# Patient Record
Sex: Female | Born: 1985 | Race: White | Hispanic: No | Marital: Single | Smoking: Current every day smoker
Health system: Southern US, Academic
[De-identification: ages and names within clinical notes are randomized; demographics above are authoritative.]

## PROBLEM LIST (undated history)

## (undated) DIAGNOSIS — M797 Fibromyalgia: Secondary | ICD-10-CM

## (undated) DIAGNOSIS — G35 Multiple sclerosis: Secondary | ICD-10-CM

---
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
## 2004-07-01 ENCOUNTER — Emergency Department (HOSPITAL_COMMUNITY): Admission: EM | Admit: 2004-07-01 | Discharge: 2004-07-01 | Payer: Self-pay | Admitting: Emergency Medicine

## 2005-01-18 ENCOUNTER — Inpatient Hospital Stay (HOSPITAL_COMMUNITY): Admission: AD | Admit: 2005-01-18 | Discharge: 2005-01-18 | Payer: Self-pay | Admitting: *Deleted

## 2005-01-20 ENCOUNTER — Inpatient Hospital Stay (HOSPITAL_COMMUNITY): Admission: AD | Admit: 2005-01-20 | Discharge: 2005-01-21 | Payer: Self-pay

## 2005-01-24 ENCOUNTER — Ambulatory Visit: Payer: Self-pay | Admitting: *Deleted

## 2005-02-07 ENCOUNTER — Ambulatory Visit: Payer: Self-pay | Admitting: *Deleted

## 2005-02-07 ENCOUNTER — Ambulatory Visit (HOSPITAL_COMMUNITY): Admission: RE | Admit: 2005-02-07 | Discharge: 2005-02-07 | Payer: Self-pay | Admitting: *Deleted

## 2005-02-09 ENCOUNTER — Ambulatory Visit: Payer: Self-pay | Admitting: *Deleted

## 2005-02-11 ENCOUNTER — Inpatient Hospital Stay (HOSPITAL_COMMUNITY): Admission: RE | Admit: 2005-02-11 | Discharge: 2005-02-14 | Payer: Self-pay | Admitting: Obstetrics & Gynecology

## 2005-02-11 ENCOUNTER — Ambulatory Visit: Payer: Self-pay | Admitting: Family Medicine

## 2005-02-11 ENCOUNTER — Encounter (INDEPENDENT_AMBULATORY_CARE_PROVIDER_SITE_OTHER): Payer: Self-pay | Admitting: Specialist

## 2005-08-05 ENCOUNTER — Emergency Department (HOSPITAL_COMMUNITY): Admission: EM | Admit: 2005-08-05 | Discharge: 2005-08-05 | Payer: Self-pay | Admitting: Emergency Medicine

## 2005-09-10 ENCOUNTER — Emergency Department (HOSPITAL_COMMUNITY): Admission: EM | Admit: 2005-09-10 | Discharge: 2005-09-10 | Payer: Self-pay | Admitting: Emergency Medicine

## 2005-11-25 ENCOUNTER — Emergency Department (HOSPITAL_COMMUNITY): Admission: EM | Admit: 2005-11-25 | Discharge: 2005-11-25 | Payer: Self-pay | Admitting: Emergency Medicine

## 2005-12-26 ENCOUNTER — Emergency Department (HOSPITAL_COMMUNITY): Admission: EM | Admit: 2005-12-26 | Discharge: 2005-12-26 | Payer: Self-pay | Admitting: Emergency Medicine

## 2006-12-02 ENCOUNTER — Emergency Department (HOSPITAL_COMMUNITY): Admission: EM | Admit: 2006-12-02 | Discharge: 2006-12-03 | Payer: Self-pay | Admitting: Emergency Medicine

## 2007-03-08 ENCOUNTER — Emergency Department (HOSPITAL_COMMUNITY): Admission: EM | Admit: 2007-03-08 | Discharge: 2007-03-08 | Payer: Self-pay | Admitting: Emergency Medicine

## 2007-03-11 ENCOUNTER — Emergency Department (HOSPITAL_COMMUNITY): Admission: EM | Admit: 2007-03-11 | Discharge: 2007-03-12 | Payer: Self-pay | Admitting: *Deleted

## 2007-07-14 ENCOUNTER — Ambulatory Visit: Payer: Self-pay | Admitting: Family Medicine

## 2007-07-14 ENCOUNTER — Inpatient Hospital Stay (HOSPITAL_COMMUNITY): Admission: AD | Admit: 2007-07-14 | Discharge: 2007-07-16 | Payer: Self-pay | Admitting: Family Medicine

## 2007-07-22 ENCOUNTER — Emergency Department (HOSPITAL_COMMUNITY): Admission: EM | Admit: 2007-07-22 | Discharge: 2007-07-22 | Payer: Self-pay | Admitting: Emergency Medicine

## 2007-07-24 ENCOUNTER — Emergency Department (HOSPITAL_COMMUNITY): Admission: EM | Admit: 2007-07-24 | Discharge: 2007-07-24 | Payer: Self-pay | Admitting: Emergency Medicine

## 2007-08-22 ENCOUNTER — Encounter: Admission: RE | Admit: 2007-08-22 | Discharge: 2007-11-20 | Payer: Self-pay | Admitting: Orthopedic Surgery

## 2008-04-14 ENCOUNTER — Emergency Department (HOSPITAL_COMMUNITY): Admission: EM | Admit: 2008-04-14 | Discharge: 2008-04-14 | Payer: Self-pay | Admitting: Emergency Medicine

## 2008-04-16 ENCOUNTER — Emergency Department (HOSPITAL_COMMUNITY): Admission: EM | Admit: 2008-04-16 | Discharge: 2008-04-16 | Payer: Self-pay | Admitting: Emergency Medicine

## 2008-09-24 ENCOUNTER — Emergency Department (HOSPITAL_COMMUNITY): Admission: EM | Admit: 2008-09-24 | Discharge: 2008-09-24 | Payer: Self-pay | Admitting: Emergency Medicine

## 2009-08-24 ENCOUNTER — Ambulatory Visit (INDEPENDENT_AMBULATORY_CARE_PROVIDER_SITE_OTHER): Payer: Self-pay | Admitting: Rheumatology

## 2009-08-24 NOTE — Telephone Encounter (Signed)
Triage Queue message copied by Clois Comber CHABLIS A on Wed Aug 24, 2009 11:18 AM  ------   Message from: Konrad Felix   Created: Wed Aug 24, 2009 10:47 AM    >> Frances Maywood Aug 24, 2009 10:47 am  Patient is scheduled to see Dr. Tenna Child 02/07/10 and her referring doctor, Dr. 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 They are faxing records.

## 2009-09-14 ENCOUNTER — Ambulatory Visit (INDEPENDENT_AMBULATORY_CARE_PROVIDER_SITE_OTHER): Payer: MEDICAID | Admitting: Rheumatology

## 2009-09-14 ENCOUNTER — Encounter (INDEPENDENT_AMBULATORY_CARE_PROVIDER_SITE_OTHER): Payer: Self-pay | Admitting: Rheumatology

## 2009-09-14 ENCOUNTER — Ambulatory Visit
Admission: RE | Admit: 2009-09-14 | Discharge: 2009-09-14 | Disposition: A | Discharge status: Home / Self Care | Payer: MEDICAID | Attending: Rheumatology | Admitting: Rheumatology

## 2009-09-14 VITALS — BP 112/72 | HR 83 | Temp 97.3°F | Ht 62.0 in | Wt 110.6 lb

## 2009-09-14 DIAGNOSIS — R21 Rash and other nonspecific skin eruption: Secondary | ICD-10-CM | POA: Insufficient documentation

## 2009-09-14 LAB — CREATININE
CREATININE: 0.75 mg/dL (ref 0.49–1.10)
ESTIMATED GLOMERULAR FILTRATION RATE: >59 mL/min/{1.73_m2} (ref >59)

## 2009-09-14 LAB — ELECTROLYTES
ANION GAP: 9 mmol/L (ref 5–16)
CARBON DIOXIDE: 29 mmol/L (ref 22–32)
CHLORIDE: 101 mmol/L (ref 96–111)
POTASSIUM: 4.3 mmol/L (ref 3.5–5.1)
SODIUM: 139 mmol/L (ref 136–145)

## 2009-09-14 LAB — CBC/DIFF
BASOPHILS: 1 % (ref 0–1)
BASOS ABS: 0.039 10*3/uL (ref 0.0–0.2)
EOS ABS: 0.078 10*3/uL — ABNORMAL LOW (ref 0.1–0.3)
EOSINOPHIL: 2 % (ref 1–6)
HCT: 40 % (ref 33.5–45.2)
HGB: 13.9 g/dL (ref 11.5–15.2)
LYMPHOCYTES: 53 % — ABNORMAL HIGH (ref 20–45)
LYMPHS ABS: 2.067 10*3/uL (ref 1.0–4.8)
MCH: 30.9 pg (ref 27.4–33.0)
MCHC: 34.6 g/dL (ref 31.6–35.5)
MONOCYTES: 8 % (ref 4–13)
MONOS ABS: 0.312 10*3/uL (ref 0.1–0.9)
MPV: 7.4 FL (ref 7.4–10.4)
PLATELET COUNT: 192 10*3/uL (ref 140–450)
PMN ABS: 1.404 10*3/uL — ABNORMAL LOW (ref 1.5–7.7)
PMN'S: 36 % — ABNORMAL LOW (ref 40–75)
RBC: 4.49 MIL/uL (ref 3.84–5.04)
RDW: 10.7 % (ref 10.2–14.0)
WBC: 3.9 10*3/uL (ref 3.5–11.0)

## 2009-09-14 LAB — URINALYSIS, MACROSCOPIC AND MICROSCOPIC
BILIRUBIN: NEGATIVE
BLOOD: NEGATIVE
GLUCOSE: NEGATIVE mg/dL
KETONES: NEGATIVE mg/dL
LEUKOCYTES: NEGATIVE
NITRITE: NEGATIVE
PH URINE: 5.5 (ref 5.0–8.0)
PROTEIN: NEGATIVE mg/dL
RBC'S: <1 /HPF (ref 0–4)
SPECIFIC GRAVITY, URINE: 1.009 (ref 1.005–1.030)
UROBILINOGEN: NORMAL mg/dL
WBC'S: <1 /HPF (ref 0–6)

## 2009-09-14 LAB — SEDIMENTATION RATE: SEDIMENTATION RATE: 3 mm/h (ref 0–20)

## 2009-09-14 LAB — C3 COMPLEMENT, SERUM: C3: 93 mg/dL (ref 86–184)

## 2009-09-14 LAB — BUN
BUN/CREAT RATIO: 11 (ref 6–22)
BUN: 8 mg/dL (ref 6–20)

## 2009-09-14 LAB — ALT (SGPT): ALT (SGPT): 18 U/L (ref 7–45)

## 2009-09-14 LAB — CALCIUM: CALCIUM: 9.3 mg/dL (ref 8.5–10.4)

## 2009-09-14 LAB — AST (SGOT): AST (SGOT): 25 U/L (ref 8–41)

## 2009-09-14 LAB — ALBUMIN: ALBUMIN: 3.9 g/dL (ref 3.5–4.8)

## 2009-09-14 LAB — C4 COMPLEMENT, SERUM: C4: 14 mg/dL — ABNORMAL LOW (ref 20–59)

## 2009-09-14 NOTE — H&P (Signed)
PCP: Dene Gentry from [place], [place]           Shawn Route , [facility] [place], [place]  Dermatologist: Memory Dance from [place], [place]      Chief Complaint  Rash- referred by her dermatologist (Dr. Johna Roles from [place]) for concern of lupus    HPI  23year old white female referred to [place] for concerns of lupus. Patient tells [place] that she developed a rash on her chest about 2-59months ago that progressed to involve her arms and then her back and face(malar). Initially she was treated with local application creams but it didn't subside. She also noticed numbness and tingling and swelling in her feet in the mornings, generalised fatigue and stomach cramps. She also gives a history of right wrist pain for 1year and low back pain for 1-36months. She doesn't recall any trauma. No fevers, no chills, no nausea, no vomiting, no diarrhea, no urinary complains, no flank pain, no other joint pains, no hematuria, no oral sores. She complains of frontal headache for a week, peripheral black falshes in right eye for a month intermittently, coughing and dry mouth in the morning but no apparent swallowing difficulty, dry eyes occasionally, c/o excessive hair fall. She was referred to a dermatologist by her PCP, when she didn't respond to local treatments he did blood work that showed elevated ANA 534, SSA 534, WBC slightly low 3.8, low plt 139. He prescribed her prednisone 2weeks course. She told us that she responded to the treatment and the rash on the arms cleared up and the chest significantly improved.   Currently she has erythematous blanchanble patches on her cheek bilaterally, 2 lesions each on heer breasts and 1-2 lesions on her upper back, wrist pain and morning foot discomfot and swelling.    No history of sick contacts, no exposure to woods, no animal bites, no insect bites, no recent travel.    ROS  Negative apart from whats been mentioned in HPI    PMH  Anxiety    PSH  Appendectomy around 1990     Allergy  NKDA    Medications  Hydrocortisone on face  Clonidine 0.1QHS    Social history  Smokes 3cig/day for 1-2 year  No alcohol  No illicit drug, no IV drug  Works in [employer], political [employer]  Has a cat doing fine    Menstrual history  Has a daughter  Regular 28 day cycles  Menses last fo 4 days  Just started her period yesterday  No mennorrhagia  No dysmennorrhea  Sexually active  Safe sexual practices    Family history  Mother had breast cancer at age of 53  Maternal grandfather had DM      Physical Examination  BP 112/72   Pulse 83   Temp 36.3 C (97.3 F)   Ht 1.575 m (5\' 2" )   Wt 50.168 kg (110 lb 9.6 oz)   SpO2 98%    Gen: healthy looking young female, no apparnt abnormality  HEENT: blanchable erythematous malar patches, no oral lesions  Chest: clear  CV: S1+S2  Skin: blanchable malar rash on face, 1-2 erythematous crusted lesions on both breasts, 1-2 lesion on left upper back  Abd: soft, non tender  MS: Right 4th MCP mild tenderness, tinel' test positive, phalen's negative  Neuro: AAOX3, CN II-XII intact  Psyche: coherent    Assessment/ Plan  Orders Placed This Encounter   . Cbc/diff   . Sedimentation rate   . Urinalysis (routine)   . Creatinine   .  C4 complement   . C3 complement   . Anti-double stranded dna ab   . Anti-ssb ab   . Anti-rnp antibody   . Ana   . Anti-sm antibody   . Ast (sgot)   . Alt (sgpt)   . Albumin   . Anti-ssa ab   . Electrolytes   . Bun   . Vitamin d 25 hydroxy   . Calcium   . Lupus anticoagulant   . Cardiolipin ab, igg and igm         1. Rash: 23year old female with 2-3 month history of rash, fatigue and feet dicomfort with elevated ANA, SSA, low plt from outside labs concerning for both rheumatologic disease and/or non-rheumatologic disease. Will order abovesaid blood test and UA. Will follow up with the results.  2. Followup: Will see her back in 1-2weeks time.    Rosiland Oz  PGY-3

## 2009-09-15 LAB — ANTI-DOUBLE STRANDED DNA AB: ANTI-DNA AB: 1:10 {titer}

## 2009-09-15 LAB — HEP-2 SUBSTRATE ANTINUCLEAR ANTIBODIES (ANA), SERUM: ANTI-NUCLEAR AB: NEGATIVE

## 2009-09-15 NOTE — H&P (Signed)
I saw and examined the patient.  I reviewed the resident's note.  I agree with the findings and plan of care as documented in the resident's note.  Any exceptions/additions are edited/noted.  She denies Raynaud's, oral ulcers, schest pain, SOB. She does note dry eyes, mouth and alopecia  annular raised, red areas noted on her cheeks and upper back.  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

## 2009-09-16 LAB — SS-B/LA ANTIBODIES, IGG, SERUM: ANTI-SSB AB, QUANTITATIVE: 1 U

## 2009-09-16 LAB — LUPUS ANTICOAGULANT: LUPUS ANTICOAGULANT: NEGATIVE

## 2009-09-16 LAB — VITAMIN D 25 HYDROXY: 25-HYDROXY VITAMIN D: 48

## 2009-09-16 LAB — SS-A/RO ANTIBODIES, IGG, SERUM: ANTI-SSA AB, QUANTITATIVE: 112 U — ABNORMAL HIGH

## 2009-09-16 LAB — PHOSPHOLIPID (CARDIOLIPIN) IGG & IGM
CARDIOLIPIN AB IGG: 3.8
CARDIOLIPIN AB IgM: 5.6

## 2009-09-16 LAB — RNP ANTIBODIES, IGG, SERUM: RIBOSOMAL PROTEIN AB, IGG (U1) QUANTITATIVE: 1 U

## 2009-09-16 LAB — SM (SMITH) ANTIBODIES, IGG, SERUM: ANTI-SM AB, QUANTITATIVE: 2 U

## 2009-10-13 ENCOUNTER — Ambulatory Visit (INDEPENDENT_AMBULATORY_CARE_PROVIDER_SITE_OTHER): Payer: MEDICAID | Admitting: Rheumatology

## 2009-10-13 VITALS — BP 104/68 | HR 80 | Temp 96.6°F | Ht 62.0 in | Wt 111.9 lb

## 2009-10-13 NOTE — Progress Notes (Signed)
SUBJECTIVE:  Jacqueline Lam is a 23 y.o. female  Who presents for a follow up visit for: RASH.  The patient was referred to rheumatololgy clinic last month by dermatology after she developed an erythematous rash on her chest about for about 2-31months then it progressed to involve her arms and then her back and face(malar) the patient also has a positive ANA at that time.  Today she came for a follow up and to discuss the result of her labs (autoimmune panel), her symptoms basically unchanged since we saw her last month, the rash in her face upper chest unchanged, she has 2 new spots of an erythematous rash in both of her shoulders, She is still complaining of   numbness and tingling and swelling in her feet in the mornings, generalized fatigue, right wrist pain, no fevers, no chills, no nausea, no vomiting, no diarrhea, no urinary complains, no flank pain, no other joint pains, no hematuria, no oral sores.  She does not complain of any dysphagia or dry mouth or mouth ulcer or problem with her teeth but she does complain of mild dryness in her eyes. She does also complain of SOB on exertion which a relatively a new complaints for her.         OBJECTIVE:   BP 104/68   Pulse 80   Temp 35.9 C (96.6 F)   Ht 1.575 m (5\' 2" )   Wt 50.758 kg (111 lb 14.4 oz)   SpO2 99%   Appears well, in no apparent distress. Alert and oriented.   No jvd, no lymphadenopathy, no lower extremities edema.  Eyes: normal conjunctivae and lids. Pupils equally reactive to light and accommodation. Extraocular muscles intact.   Head: normocephalic and atraumatic.   Mouth/Throat: no oropharyngeal exudate.   Lungs: clear to auscultation bilaterally, no crackles no wheezing.  Cardiovascular: regular rate and rhythm, normal first and second heart sounds, no murmurs, rubs, or gallops.   Abdomen: soft, non-tender, non-distended, normoactive bowel sounds, no hepatosplenomegaly or masses.   Musculoskeletal: no clubbing, cyanosis, or edema, no joint deformities, no joint tenderness except for Right 4th MCP mild tenderness and swelling ( No synovitis by my exam- JAH)  Neurological: awake, alert, and oriented x3, DTR's 2+ equal, strength 5/5, sensation grossly intact, CN II-XII intact  Skin: blanchable malar rash on face, 1-2 erythematous erythematous lesions on both breasts, 1-2 erhythmatous lesion on left upper back, 3 erhythmatous lesions in both shoulders.    ASSESSMENT AND PLAN:  23 y.o. female  with 3 month history of rash came for a follow up of her labs result.  The autoimmune work up was only positive for anti-ssa which is non-specific and it does not correlate with patient presentation. The differential diagnosis includes Sjogren's syndrome,  subacute coetaneous lupus, primary dermatology process.  -we recommends a skin biopsy.   -will refer to ophthalmology for eye exam and possible schirmer test and biopsy.  -patient will follow up with her PCP for lymphocytosis and SOB  -follow up in approximately 1 month

## 2009-10-16 ENCOUNTER — Emergency Department (HOSPITAL_COMMUNITY): Admission: EM | Admit: 2009-10-16 | Discharge: 2009-10-16 | Payer: Self-pay | Admitting: Emergency Medicine

## 2009-10-17 NOTE — Progress Notes (Signed)
See resident's note for details. I saw and evaluated the patient and agree with the resident's findings and plans as written except as noted:  She had no synovitis by my examination.  Mild lymphocytosis and atypical lymphs on CBC  Have recommended she see her pcp for evaluation of this. Actually, it is more common to see lymphopenia in SLE.  Will refer her to Ophthalmology for schirmer's test and if positive, will request a lip biopsy lo to confirm Sjogren's  Recommend Derm f/u for skin biopsy and she will call  Her dermatologist to be seen for that  Alwyn Pea, MD 10/17/2009, 12:55 PM

## 2009-11-30 ENCOUNTER — Ambulatory Visit
Admission: RE | Admit: 2009-11-30 | Discharge: 2009-11-30 | Disposition: A | Discharge status: Home / Self Care | Payer: MEDICAID | Attending: Rheumatology | Admitting: Rheumatology

## 2009-11-30 ENCOUNTER — Ambulatory Visit (INDEPENDENT_AMBULATORY_CARE_PROVIDER_SITE_OTHER): Payer: MEDICAID | Admitting: Rheumatology

## 2009-11-30 VITALS — BP 114/72 | HR 104 | Temp 98.8°F | Ht 62.0 in | Wt 108.6 lb

## 2009-11-30 DIAGNOSIS — R21 Rash and other nonspecific skin eruption: Secondary | ICD-10-CM | POA: Insufficient documentation

## 2009-11-30 DIAGNOSIS — H11149 Conjunctival xerosis, unspecified, unspecified eye: Secondary | ICD-10-CM | POA: Insufficient documentation

## 2009-11-30 NOTE — Progress Notes (Signed)
Subjective  Jacqueline Lam is a 24 y.o. year old female who presents for Results, Has Questions and Follow-up  to clinic.    Patient reports that she had not gotten a skin biopsy yet and will not see her until the 12th for skin biopsy.    She reports that her Ophthalmologic exam was abnormal with very severely dry eyes.  Records from Ophthalmologist revealed significantly dry eyes.    She reports that since her last visit.  She has not had any more episodes of shortness of breath.  She reports that prior to the last visit she was having a large amount of fatigue and joint ache, but had improved since last visit.  She reports that the rash is unchanged.  She reports that she has not had any oral lesions.    Objective    Vitals: BP 114/72   Pulse 104   Temp 37.1 C (98.8 F)   Ht 1.575 m (5\' 2" )   Wt 49.261 kg (108 lb 9.6 oz)   SpO2 98%  General:  no acute distress  HENT:Mouth without ulceration.  Cardiovascular: RRR without murmur.  Lungs: Clear to auscultation bilaterally.   Joints:  Full ROM and no synovitis of the upper or lower extremities  Skin: malar rash  Neurologic: Up to exam table without assistance, Grossly intact.  Lymphatics: No lymphadenopathy    Assessment/Plan  Encounter Diagnoses   Code Name Primary? Qualifier   . 782.1R Rash Yes    . 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  Patient to get skin biopsy in the future.  If biopsy reveals Lupus than will begin treatment.  If biopsy is nondiagnostic than will likely refer for salivary gland biopsy.    Will get G6PD testing at this time to see if patient can take Plaquenil in the future.  Information about Plaquenil was given to the patient for her to read.    Orders Placed This Encounter   . Gluc-6-phosphate dehydrogenase       Mellody Drown, MD

## 2009-11-30 NOTE — Progress Notes (Signed)
I saw and evaluated the patient. I reviewed the resident's note. I agree with the findings and plan of care as documented in the resident's note. Any exceptions/additions are noted.      Alyssha Housh Ann Allen Marilou Barnfield, MD 11/30/2009, 3:43 PM

## 2009-12-02 LAB — GLUCOSE-6-PHOSPHATE DEHYDROGENASE (G-6-PD), QUANTITATIVE, ERYTHROCYTES: GLUC-6-PHOS. DEHYDROG.: 11.5

## 2010-02-07 ENCOUNTER — Ambulatory Visit (INDEPENDENT_AMBULATORY_CARE_PROVIDER_SITE_OTHER): Payer: Self-pay | Admitting: Rheumatology

## 2010-02-28 ENCOUNTER — Encounter (INDEPENDENT_AMBULATORY_CARE_PROVIDER_SITE_OTHER): Payer: MEDICAID | Admitting: Rheumatology

## 2010-04-27 ENCOUNTER — Ambulatory Visit
Admission: RE | Admit: 2010-04-27 | Discharge: 2010-04-27 | Disposition: A | Discharge status: Home / Self Care | Payer: MEDICAID | Attending: Rheumatology | Admitting: Rheumatology

## 2010-04-27 ENCOUNTER — Ambulatory Visit (INDEPENDENT_AMBULATORY_CARE_PROVIDER_SITE_OTHER): Payer: MEDICAID | Admitting: Rheumatology

## 2010-04-27 ENCOUNTER — Ambulatory Visit (HOSPITAL_BASED_OUTPATIENT_CLINIC_OR_DEPARTMENT_OTHER)
Admission: RE | Admit: 2010-04-27 | Discharge: 2010-04-27 | Disposition: A | Discharge status: Home / Self Care | Payer: MEDICAID | Source: Ambulatory Visit | Attending: Rheumatology | Admitting: Rheumatology

## 2010-04-27 ENCOUNTER — Encounter (INDEPENDENT_AMBULATORY_CARE_PROVIDER_SITE_OTHER): Payer: Self-pay | Admitting: Rheumatology

## 2010-04-27 DIAGNOSIS — M2550 Pain in unspecified joint: Secondary | ICD-10-CM | POA: Insufficient documentation

## 2010-04-27 DIAGNOSIS — R0609 Other forms of dyspnea: Secondary | ICD-10-CM | POA: Insufficient documentation

## 2010-04-27 LAB — BASIC METABOLIC PANEL
ANION GAP: 6 mmol/L (ref 5–16)
BUN/CREAT RATIO: 11 (ref 6–22)
BUN: 8 mg/dL (ref 6–20)
CALCIUM: 8.8 mg/dL (ref 8.5–10.4)
CARBON DIOXIDE: 30 mmol/L (ref 22–32)
CHLORIDE: 102 mmol/L (ref 96–111)
CREATININE: 0.74 mg/dL (ref 0.49–1.10)
ESTIMATED GLOMERULAR FILTRATION RATE: >59 mL/min/{1.73_m2} (ref >59)
GLUCOSE,NONFAST: 80 mg/dL (ref 65–139)
POTASSIUM: 3.8 mmol/L (ref 3.5–5.1)
SODIUM: 138 mmol/L (ref 136–145)

## 2010-04-27 LAB — CBC/DIFF
BASOPHILS: 1 % (ref 0–1)
BASOS ABS: 0.046 10*3/uL (ref 0.0–0.2)
EOS ABS: 0.403 10*3/uL — ABNORMAL HIGH (ref 0.1–0.3)
EOSINOPHIL: 8 % — ABNORMAL HIGH (ref 1–6)
HCT: 38.6 % (ref 33.5–45.2)
HGB: 13.5 g/dL (ref 11.5–15.2)
LYMPHOCYTES: 48 % — ABNORMAL HIGH (ref 20–45)
LYMPHS ABS: 2.56 10*3/uL (ref 1.0–4.8)
MCH: 31.4 pg (ref 27.4–33.0)
MCHC: 35 g/dL (ref 31.6–35.5)
MONOCYTES: 11 % (ref 4–13)
MONOS ABS: 0.547 10*3/uL (ref 0.1–0.9)
MPV: 7.4 FL (ref 7.4–10.4)
NRBC'S: 0 /100{WBCs}
PLATELET COUNT: 181 10*3/uL (ref 140–450)
PMN ABS: 1.66 10*3/uL (ref 1.5–7.7)
PMN'S: 32 % — ABNORMAL LOW (ref 40–75)
RBC: 4.31 MIL/uL (ref 3.84–5.04)
RDW: 10.8 % (ref 10.2–14.0)
WBC: 5.2 10*3/uL (ref 3.5–11.0)

## 2010-04-27 LAB — URINALYSIS, MICROSCOPIC
RBC'S: NONE SEEN /HPF (ref 0–4)
WBC'S: 1 /HPF (ref 0–6)

## 2010-04-27 LAB — SEDIMENTATION RATE: SEDIMENTATION RATE: 10 mm/h (ref 0–20)

## 2010-04-27 NOTE — Progress Notes (Signed)
Subjective:     Patient ID:  Anjalee Cope is an 24 y.o. female   Chief Complaint   Patient presents with   . Follow-up         HPI 24 yo female here for follow up. She did have a skin biopsy performed which we do not have the results of. She has continued to have a facial rash and now has the same rash on her left shoulder. The rash is worse in the sun. The rash on the face is itchy at times. She continues to have very dry eyes and dry mouth. No mouth sores currently. Her right wrist continues to bother her and now her left wrist is painful. She has significant joint stiffness in the morning which takes a few hours to improve. She notes mild swelling in the joints of her hand. She also mentions that she occasionally has numbness in her hands which happens in the morning and sometimes at work.  She also has dyspnea with exertion occasionally. No orthopnea. No cough.     ROS  CONSTITUTIONAL: no fever or chills, +fatigue  NEURO: no weakness, + paresthesias in hands  HEENT: no headache, no sore throat  CARDIOVASCULAR: no chest pain  RESPIRATORY: + shortness of breath, no cough  GI: no nausea, vomiting, or diarrhea  GU: no dysuria  MUSCULOSKELETAL: + joint pain  SKIN: +rash  MENTAL: no depression or anxiety  ENDOCRINE: no history of diabetes  HEME/LYMPH: no history of clots  ALLERGY: no history of anaphylaxis        Objective:   Physical Exam  BP 118/68   Pulse 90   Temp 36.1 C (97 F)   Ht 1.575 m (5\' 2" )   Wt 52.118 kg (114 lb 14.4 oz)   BMI 21.02 kg/m2   SpO2 99%  GENERAL: no acute distress  HEENT: no oral ulcers  HEART: RRR, no murmur  LUNGS: clear to auscultation  EXTREMITIES: no edema, mild synovitis in the bilateral PIP joints, +Tinnel's sign in the bilateral wrists  SKIN: erythematous plaques on the face sparing the nasolabial folds, left shoulder with small erythematous macules    .  Marland Kitchen   Assessment & Plan:      24 yo female with questionable SLE vs other autoimmune disorder. Previous ANA was negative. SSA was only antibody positive. We will likely start Plaquenil after the biopsy results are received.   -will fax release of records for pathology of skin biopsy   -check CBC diff, BMP, ESR, U/A, repeat SSA   -will order CXR given her history of dyspnea, may need PFTs in the future    Patient appears to have symptoms of carpal tunnel in her bilateral wrists.    -wrote script for wrist splints    Follow up in 3 months

## 2010-04-28 LAB — SS-A/RO ANTIBODIES, IGG, SERUM: ANTI-SSA AB, QUANTITATIVE: 130 U — ABNORMAL HIGH

## 2010-05-25 NOTE — Progress Notes (Signed)
I saw and evaluated the patient. I reviewed the resident's note. I agree with the findings and plan of care as documented in the resident's note. Any exceptions/additions are noted.      Alwyn Pea, MD 05/25/2010, 3:00 PM

## 2010-06-04 ENCOUNTER — Emergency Department (HOSPITAL_COMMUNITY): Admission: EM | Admit: 2010-06-04 | Discharge: 2010-06-04 | Payer: Self-pay | Admitting: Emergency Medicine

## 2010-08-01 ENCOUNTER — Encounter (INDEPENDENT_AMBULATORY_CARE_PROVIDER_SITE_OTHER): Payer: MEDICAID | Admitting: Rheumatology

## 2011-02-11 LAB — URINE MICROSCOPIC-ADD ON

## 2011-02-11 LAB — URINALYSIS, ROUTINE W REFLEX MICROSCOPIC
Bilirubin Urine: NEGATIVE
Glucose, UA: NEGATIVE mg/dL
Nitrite: NEGATIVE
Protein, ur: NEGATIVE mg/dL

## 2011-02-11 LAB — GC/CHLAMYDIA PROBE AMP, GENITAL
Chlamydia, DNA Probe: NEGATIVE
GC Probe Amp, Genital: NEGATIVE

## 2011-02-11 LAB — DIFFERENTIAL
Basophils Relative: 0 % (ref 0–1)
Lymphs Abs: 0.9 10*3/uL (ref 0.7–4.0)
Monocytes Relative: 2 % — ABNORMAL LOW (ref 3–12)
Neutro Abs: 15.3 10*3/uL — ABNORMAL HIGH (ref 1.7–7.7)
Neutrophils Relative %: 93 % — ABNORMAL HIGH (ref 43–77)

## 2011-02-11 LAB — POCT I-STAT, CHEM 8
Chloride: 104 mEq/L (ref 96–112)
Creatinine, Ser: 0.8 mg/dL (ref 0.4–1.2)
Glucose, Bld: 113 mg/dL — ABNORMAL HIGH (ref 70–99)
HCT: 41 % (ref 36.0–46.0)
Potassium: 3.7 mEq/L (ref 3.5–5.1)
Sodium: 138 mEq/L (ref 135–145)

## 2011-02-11 LAB — CBC
Hemoglobin: 12.6 g/dL (ref 12.0–15.0)
RBC: 4.06 MIL/uL (ref 3.87–5.11)

## 2011-04-13 NOTE — Discharge Summary (Signed)
Hannah White, Hannah White                ACCOUNT NO.:  [id number]   MEDICAL RECORD NO.:  [id number]          PATIENT TYPE:  INP   LOCATION:  9145                          FACILITY:  [facility]   PHYSICIAN:  Tanya S. Shawnie Pons, M.D.   DATE OF BIRTH:  10/18/1986   DATE OF ADMISSION:  02/11/2005  DATE OF DISCHARGE:  02/14/2005                                 DISCHARGE SUMMARY   FINAL DIAGNOSES:  1.  Intrauterine pregnancy at 37 and two-sevenths weeks.  2.  Twin gestation.  3.  Labor.  4.  Breech presentation of twin A.   PROCEDURES:  Primary low tranverse C-section.   REASON FOR HOSPITALIZATION:  Briefly, the patient is a 25 year old gravida 2  para 1 who had late prenatal care and a twin gestation who presented at 65  and two-sevenths weeks in active labor with ruptured membranes with twin A  in a breech presentation.   HOSPITAL COURSE:  The patient was admitted on the day of presentation and  underwent a primary low transverse C-section for breech. The patient  postpartum was transferred to the floor. Her infants did well. She had a  routine postoperative course and was discharged home on postoperative day  #3.   PERTINENT LABORATORY VALUES:  Preoperative hemoglobin of 10.3, postoperative  hemoglobin of 8.3. Nonreactive RPR. Placental pathology shows dichorionic-  diamniotic twin placenta with two three-vessel cords.   ADDITIONAL FINDINGS:  Twin A:  Apgars 8 and 9, weight 5 pounds 5 ounces.  Twin B:  Apgars 8 and 9, weight 5 pounds 10 ounces.   DISCHARGE DISPOSITION AND CONDITION:  The patient is discharged home in good  condition.   DISCHARGE INSTRUCTIONS:  Include follow-up postpartum visit in 6 weeks to  the health department.   DISCHARGE MEDICATIONS:  1.  Percocet one to two p.o. q.4-6h. p.r.n. pain.  2.  Ibuprofen.  3.  The patient did receive Depo prior to discharge.  4.  Iron supplementation.      TSP/MEDQ  D:  04/17/2005  T:  04/17/2005  Job:  [id number]
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
## 2011-04-13 NOTE — Op Note (Signed)
NAMETHERESA, WEDEL                ACCOUNT NO.:  [id number]   MEDICAL RECORD NO.:  [id number]          PATIENT TYPE:  INP   LOCATION:  9145                          FACILITY:  [facility]   PHYSICIAN:  Jon Gills, M.D.  DATE OF BIRTH:  Oct 31, 1986   DATE OF PROCEDURE:  02/11/2005  DATE OF DISCHARGE:                                 OPERATIVE REPORT   PREOPERATIVE DIAGNOSIS:  1.  37-2/7 weeks intrauterine pregnancy.  2.  Twin gestation.  3.  Breech presentation of baby A.  4.  Spontaneous rupture of membranes and labor.   POSTOPERATIVE DIAGNOSIS:  1.  37-2/7 weeks intrauterine pregnancy.  2.  Twin gestation.  3.  Breech presentation of baby A.  4.  Spontaneous rupture of membranes and labor.   PROCEDURE:  Low transverse cesarean section.   SURGEON:  Shelbie Proctor. Shawnie Pons, M.D.   ASSISTANT:  Jon Gills, M.D.   ANESTHESIA:  Spinal, 22 mL of 1% Marcaine for local anesthesia.   ESTIMATED BLOOD LOSS:  750 mL.   URINE OUTPUT:  300 mL.   FLUIDS REPLACED:  2500 mL.   INDICATIONS FOR PROCEDURE:  A 25 year old gravida 2, para 1-0-0-1, at term  with known breech presentation of baby A, presenting with spontaneous  rupture of membranes and contractions.   FINDINGS:  Viable infant female, A, in the breech/footling presentation.  Clear fluid.  Apgars 8 and 9.  Weight 5 pounds 5 ounces.  Viable infant B in  the cephalic presentation.  Clear fluid.  Apgars 8 and 9.  Weight 5 pounds  10 ounces.  Normal uterus, tubes, and ovaries.   DESCRIPTION OF PROCEDURE:  The patient was taken to the operating room where  spinal anesthesia was found to be adequate.  She was prepped and draped in  the usual sterile fashion in the dorsal supine position.  A Pfannenstiel  skin incision was made with a scalpel and carried through the underlying  layer of ascia with a Bovie.  The fascia was incised in the midline with the  Bovie and the incision was extended laterally.  The superior aspect of the  fascial  incision was grasped with Kochers, elevated, and the underlying  rectus muscle dissected off bluntly.  Attention was turned to the inferior  aspect and in a similar fashion was grasped, tented up with Kochers, and the  rectus muscles dissected off bluntly.  The rectus muscles were then  separated in the midline, and the peritoneum was identified and entered  bluntly.  The peritoneal incision was extended with good visualization of  the bladder.  The bladder blade was inserted and the lower uterine segment  was then incised in a transverse fashion with the scalpel.  The uterine  incision was extended laterally bluntly.  The bladder blade was removed and  the feet were delivered of baby A with the remainder of the body and head  delivered atraumatically.  The nose and mouth were suctioned with the bulb  and the cord was clamped and cut.  The infant was handed off to the awaiting  NICU team in attendance.  Infant B was then delivered in the cephalic  presentation atraumatically.  The nose and mouth were suctioned with the  bulb and the cord was clamped and cut.  The B infant was handed off to the  awaiting NICU team in attendance.  Cord blood was sent on both.   The placentas were removed and the uterus was cleared of all clots and  debris.  The uterine incision was repaired with 1-0 Vicryl in a running  locking fashion.  A second layer of the same suture was used to obtain  excellent hemostasis.  The gutters were cleared of all clots.  The fascia  was reapproximated with 0 Vicryl in a running fashion.  The skin was closed  with staples.   The patient tolerated the procedure well.  Needle, sponge, and instrument  counts correct x2.  2 grams of Ancef was given at cord clamp.  The patient  was taken to the recovery room in stable condition.      LC/MEDQ  D:  02/11/2005  T:  02/12/2005  Job:  [id number]

## 2011-08-22 LAB — URINALYSIS, ROUTINE W REFLEX MICROSCOPIC
Hgb urine dipstick: NEGATIVE
Specific Gravity, Urine: >1.046 — ABNORMAL HIGH
Urobilinogen, UA: 0.2

## 2011-08-22 LAB — DIFFERENTIAL
Lymphocytes Relative: 39
Lymphs Abs: 2.8
Monocytes Relative: 5
Neutro Abs: 4
Neutrophils Relative %: 55

## 2011-08-22 LAB — POCT I-STAT, CHEM 8
BUN: 6
Chloride: 104
Creatinine, Ser: 0.7
Glucose, Bld: 81
Potassium: 3.6
Sodium: 141

## 2011-08-22 LAB — CBC
MCV: 92.5
Platelets: 242
RBC: 3.4 — ABNORMAL LOW
WBC: 7.2

## 2011-08-22 LAB — RAPID URINE DRUG SCREEN, HOSP PERFORMED
Benzodiazepines: NOT DETECTED
Cocaine: NOT DETECTED
Opiates: POSITIVE — AB
Tetrahydrocannabinol: POSITIVE — AB

## 2011-09-07 LAB — COMPREHENSIVE METABOLIC PANEL
ALT: 9
AST: 22
Albumin: 2.7 — ABNORMAL LOW
Alkaline Phosphatase: 180 — ABNORMAL HIGH
CO2: 29
Chloride: 107
GFR calc Af Amer: >60
GFR calc non Af Amer: >60
Potassium: 3.9
Total Bilirubin: 0.3

## 2011-09-07 LAB — BASIC METABOLIC PANEL
BUN: 8
CO2: 21
Chloride: 112
Creatinine, Ser: 0.47
GFR calc Af Amer: >60
Glucose, Bld: 76

## 2011-09-07 LAB — CBC
HCT: 28.2 — ABNORMAL LOW
Platelets: 165
Platelets: 201
Platelets: 343
RBC: 3.04 — ABNORMAL LOW
RDW: 12.4
RDW: 12.5
WBC: 10.4
WBC: 12.6 — ABNORMAL HIGH
WBC: 6

## 2011-09-07 LAB — DIFFERENTIAL
Basophils Absolute: 0
Basophils Absolute: 0
Eosinophils Absolute: 0.1
Eosinophils Relative: 1
Lymphocytes Relative: 30
Lymphocytes Relative: 38
Lymphs Abs: 3.1
Lymphs Abs: 2.2
Neutro Abs: 3.3
Neutrophils Relative %: 64
Neutrophils Relative %: 55

## 2011-09-07 LAB — RUBELLA SCREEN: Rubella: 126.7 — ABNORMAL HIGH

## 2011-09-07 LAB — RAPID HIV SCREEN (WH-MAU): Rapid HIV Screen: NONREACTIVE

## 2011-09-07 LAB — RAPID URINE DRUG SCREEN, HOSP PERFORMED
Benzodiazepines: NOT DETECTED
Cocaine: NOT DETECTED
Tetrahydrocannabinol: NOT DETECTED

## 2011-09-07 LAB — SICKLE CELL SCREEN: Sickle Cell Screen: NEGATIVE

## 2011-09-07 LAB — HEPATITIS B SURFACE ANTIGEN: Hepatitis B Surface Ag: NEGATIVE

## 2013-03-15 ENCOUNTER — Emergency Department (HOSPITAL_COMMUNITY): Payer: Self-pay

## 2013-03-15 ENCOUNTER — Emergency Department (HOSPITAL_COMMUNITY)
Admission: EM | Admit: 2013-03-15 | Discharge: 2013-03-15 | Disposition: A | Discharge status: Home / Self Care | Payer: Self-pay | Attending: Emergency Medicine | Admitting: Emergency Medicine

## 2013-03-15 DIAGNOSIS — K2990 Gastroduodenitis, unspecified, without bleeding: Secondary | ICD-10-CM | POA: Insufficient documentation

## 2013-03-15 DIAGNOSIS — R1013 Epigastric pain: Secondary | ICD-10-CM | POA: Insufficient documentation

## 2013-03-15 DIAGNOSIS — K2970 Gastritis, unspecified, without bleeding: Secondary | ICD-10-CM | POA: Insufficient documentation

## 2013-03-15 DIAGNOSIS — R112 Nausea with vomiting, unspecified: Secondary | ICD-10-CM | POA: Insufficient documentation

## 2013-03-15 LAB — POCT I-STAT TROPONIN I: Troponin i, poc: 0 ng/mL (ref 0.00–0.08)

## 2013-03-15 LAB — POCT I-STAT, CHEM 8
BUN: 18 mg/dL (ref 6–23)
Calcium, Ion: 1.25 mmol/L — ABNORMAL HIGH (ref 1.12–1.23)
Chloride: 107 mEq/L (ref 96–112)
Creatinine, Ser: 0.8 mg/dL (ref 0.50–1.10)
Sodium: 142 mEq/L (ref 135–145)
TCO2: 24 mmol/L (ref 0–100)

## 2013-03-15 LAB — CBC WITH DIFFERENTIAL/PLATELET
Basophils Absolute: 0 10*3/uL (ref 0.0–0.1)
Eosinophils Relative: 0 % (ref 0–5)
HCT: 36.6 % (ref 36.0–46.0)
Lymphocytes Relative: 30 % (ref 12–46)
Lymphs Abs: 3.3 10*3/uL (ref 0.7–4.0)
MCV: 92 fL (ref 78.0–100.0)
Monocytes Absolute: 0.7 10*3/uL (ref 0.1–1.0)
Neutro Abs: 7.1 10*3/uL (ref 1.7–7.7)
RBC: 3.98 MIL/uL (ref 3.87–5.11)
RDW: 11.6 % (ref 11.5–15.5)
WBC: 11 10*3/uL — ABNORMAL HIGH (ref 4.0–10.5)

## 2013-03-15 MED ORDER — FAMOTIDINE 20 MG PO TABS: 20.0000 mg | ORAL_TABLET | Freq: Two times a day (BID) | ORAL | Status: DC

## 2013-03-15 MED ORDER — OMEPRAZOLE 20 MG PO CPDR: 20.0000 mg | DELAYED_RELEASE_CAPSULE | Freq: Every day | ORAL | Status: DC

## 2013-03-15 MED ORDER — HYDROCODONE-ACETAMINOPHEN 5-325 MG PO TABS
1.0000 | ORAL_TABLET | Freq: Once | ORAL | Status: AC
  Administered 2013-03-15: 1 via ORAL
  Filled 2013-03-15: qty 1

## 2013-03-15 MED ORDER — WHITE PETROLATUM GEL
Status: AC
  Administered 2013-03-15: 1
  Filled 2013-03-15: qty 5

## 2013-03-15 MED ORDER — GI COCKTAIL ~~LOC~~
30.0000 mL | Freq: Once | ORAL | Status: AC
  Administered 2013-03-15: 30 mL via ORAL
  Filled 2013-03-15: qty 30

## 2013-03-15 NOTE — ED Provider Notes (Signed)
Medical screening examination/treatment/procedure(s) were performed by non-physician practitioner and as supervising physician I was immediately available for consultation/collaboration.  Olivia Mackie, MD 03/15/13 [phone]

## 2013-03-15 NOTE — ED Provider Notes (Signed)
History     CSN: [id number]  Arrival date & time 03/15/13  0014   First MD Initiated Contact with Patient 03/15/13 0049      Chief Complaint  Patient presents with  . Chest Pain   HPI  History provided by the patient. Patient is a 27 year old female with no significant PMH who presents with complaints of episodes of left and sternal chest pains and discomforts. Patient states she has had intermittent occasional chest pain and discomfort most typically sharp and brief lasting seconds to 1 minute. Symptoms have been occurring for the past one to 2 months at in 3. times. She has not noticed any pattern. They do not seem to occur during activity and may happen at rest. They do not seem to be associated with eating meals. Patient does take a regular Advil for headaches and states some of her headache symptoms have increased during this past month. Today however her symptoms seemed much worse. She first had an episode of squeezing severe chest discomfort around 5 to 6 AM while lying in bed aqnd lasting 2 minutes. Symptoms then returned again this evening approximately 2 hours prior to arrival. Symptoms are not associated with any shortness of breath, heart palpitations or sweats. She does report having some nausea this evening with an episode of vomiting. No other aggravating or alleviating factors. No other associated symptoms. No fever, chills or sweats.     No past medical history on file.  No past surgical history on file.  No family history on file.  History  Substance Use Topics  . Smoking status: Not on file  . Smokeless tobacco: Not on file  . Alcohol Use: Not on file    OB History   No data available      Review of Systems  Constitutional: Negative for fever.  Respiratory: Negative for cough and shortness of breath.   Cardiovascular: Positive for chest pain. Negative for palpitations.  Gastrointestinal: Positive for nausea and vomiting. Negative for abdominal pain,  diarrhea, constipation and blood in stool.  All other systems reviewed and are negative.    Allergies  Other  Home Medications   Current Outpatient Rx  Name  Route  Sig  Dispense  Refill  . ibuprofen (ADVIL,MOTRIN) 200 MG tablet   Oral   Take 200 mg by mouth every 6 (six) hours as needed for pain.           BP 145/98  Pulse 98  Temp(Src) 98.6 F (37 C) (Oral)  Resp 16  SpO2 98%  LMP 02/16/2013  Physical Exam  Nursing note and vitals reviewed. Constitutional: She is oriented to person, place, and time. She appears well-developed and well-nourished. No distress.  HENT:  Head: Normocephalic.  Cardiovascular: Normal rate and regular rhythm.   Pulmonary/Chest: Effort normal and breath sounds normal. No respiratory distress. She has no wheezes. She has no rales.  Abdominal: Soft. She exhibits no distension. There is tenderness in the epigastric area. There is no rebound and no guarding.  Musculoskeletal: Normal range of motion. She exhibits no edema and no tenderness.  No clinical signs concerning for DVT  Neurological: She is alert and oriented to person, place, and time.  Skin: Skin is warm and dry. No rash noted.  Psychiatric: She has a normal mood and affect. Her behavior is normal.    ED Course  Procedures   Results for orders placed during the hospital encounter of 03/15/13  CBC WITH DIFFERENTIAL  Result Value Range   WBC 11.0 (*) 4.0 - 10.5 K/uL   RBC 3.98  3.87 - 5.11 MIL/uL   Hemoglobin 12.3  12.0 - 15.0 g/dL   HCT 14.7  82.9 - 56.2 %   MCV 92.0  78.0 - 100.0 fL   MCH 30.9  26.0 - 34.0 pg   MCHC 33.6  30.0 - 36.0 g/dL   RDW 13.0  86.5 - 78.4 %   Platelets 260  150 - 400 K/uL   Neutrophils Relative 64  43 - 77 %   Neutro Abs 7.1  1.7 - 7.7 K/uL   Lymphocytes Relative 30  12 - 46 %   Lymphs Abs 3.3  0.7 - 4.0 K/uL   Monocytes Relative 6  3 - 12 %   Monocytes Absolute 0.7  0.1 - 1.0 K/uL   Eosinophils Relative 0  0 - 5 %   Eosinophils Absolute 0.0   0.0 - 0.7 K/uL   Basophils Relative 0  0 - 1 %   Basophils Absolute 0.0  0.0 - 0.1 K/uL  POCT I-STAT, CHEM 8      Result Value Range   Sodium 142  135 - 145 mEq/L   Potassium 3.6  3.5 - 5.1 mEq/L   Chloride 107  96 - 112 mEq/L   BUN 18  6 - 23 mg/dL   Creatinine, Ser 6.96  0.50 - 1.10 mg/dL   Glucose, Bld 83  70 - 99 mg/dL   Calcium, Ion 2.95 (*) 1.12 - 1.23 mmol/L   TCO2 24  0 - 100 mmol/L   Hemoglobin 13.6  12.0 - 15.0 g/dL   HCT 28.4  13.2 - 44.0 %  POCT I-STAT TROPONIN I      Result Value Range   Troponin i, poc 0.00  0.00 - 0.08 ng/mL   Comment 3                Dg Chest 2 View  03/15/2013  *RADIOLOGY REPORT*  Clinical Data: Left chest pain radiating to arm, shortness of breath for 2 days, smoker  CHEST - 2 VIEW  Comparison: None  Findings: Normal heart size, mediastinal contours, and pulmonary vascularity. Lungs clear. Bones unremarkable. No pneumothorax.  IMPRESSION: No acute abnormalities.   Original Report Authenticated By: Ulyses Southward, M.D.      1. Gastritis       MDM  12:45 AM patient seen and evaluated. Patient appears well in no acute distress. Symptoms atypical for ACS. Patient without significant risk factors. Patient is PERC negative. She has reproducible epigastric pains. Suspect gastritis versus GERD versus esophageal spasm. Will give GI cocktail check basic lab tests.  Patient having some improvement after GI cocktail. Does feel some indigestion symptoms at this time after the GI cocktail. Slight pains. Patient's lab tests, EKG and chest x-ray unremarkable. Doubt any concerning process. Will recommend daily and acids and PCP followup     Date: 03/15/2013  Rate: 84  Rhythm: normal sinus rhythm  QRS Axis: normal  Intervals: normal  ST/T Wave abnormalities: normal  Conduction Disutrbances:none  Narrative Interpretation:   Old EKG Reviewed: none available          Angus Seller, PA-C 03/15/13 [phone]

## 2013-03-15 NOTE — ED Notes (Signed)
Pt c/o L anterior chest pain off and on for 1 1/2 months. Pt describes pain as sharp, stabbing. Pt states she gets headaches and nausea when the pain comes on. Pt states she has been under a lot of stress lately, drinks a lot of caffeine drinks and is a smoker. Pt denies chest pain at present, but states her L forearm is hurting. Pt in no acute distress, skin warm and dry. Denies nausea.

## 2013-07-13 ENCOUNTER — Encounter (HOSPITAL_COMMUNITY): Payer: Self-pay | Admitting: *Deleted

## 2013-07-13 ENCOUNTER — Emergency Department (HOSPITAL_COMMUNITY)
Admission: EM | Admit: 2013-07-13 | Discharge: 2013-07-13 | Disposition: A | Discharge status: Home / Self Care | Payer: Self-pay | Attending: Emergency Medicine | Admitting: Emergency Medicine

## 2013-07-13 ENCOUNTER — Emergency Department (HOSPITAL_COMMUNITY): Payer: Self-pay

## 2013-07-13 DIAGNOSIS — R5381 Other malaise: Secondary | ICD-10-CM | POA: Insufficient documentation

## 2013-07-13 DIAGNOSIS — N898 Other specified noninflammatory disorders of vagina: Secondary | ICD-10-CM | POA: Insufficient documentation

## 2013-07-13 DIAGNOSIS — F17200 Nicotine dependence, unspecified, uncomplicated: Secondary | ICD-10-CM | POA: Insufficient documentation

## 2013-07-13 DIAGNOSIS — R000 Tachycardia, unspecified: Secondary | ICD-10-CM | POA: Insufficient documentation

## 2013-07-13 DIAGNOSIS — N730 Acute parametritis and pelvic cellulitis: Secondary | ICD-10-CM | POA: Insufficient documentation

## 2013-07-13 DIAGNOSIS — R51 Headache: Secondary | ICD-10-CM | POA: Insufficient documentation

## 2013-07-13 DIAGNOSIS — Z8739 Personal history of other diseases of the musculoskeletal system and connective tissue: Secondary | ICD-10-CM | POA: Insufficient documentation

## 2013-07-13 DIAGNOSIS — Z3202 Encounter for pregnancy test, result negative: Secondary | ICD-10-CM | POA: Insufficient documentation

## 2013-07-13 DIAGNOSIS — M549 Dorsalgia, unspecified: Secondary | ICD-10-CM | POA: Insufficient documentation

## 2013-07-13 DIAGNOSIS — R42 Dizziness and giddiness: Secondary | ICD-10-CM | POA: Insufficient documentation

## 2013-07-13 DIAGNOSIS — R112 Nausea with vomiting, unspecified: Secondary | ICD-10-CM | POA: Insufficient documentation

## 2013-07-13 DIAGNOSIS — Z8669 Personal history of other diseases of the nervous system and sense organs: Secondary | ICD-10-CM | POA: Insufficient documentation

## 2013-07-13 DIAGNOSIS — R1110 Vomiting, unspecified: Secondary | ICD-10-CM | POA: Insufficient documentation

## 2013-07-13 DIAGNOSIS — N12 Tubulo-interstitial nephritis, not specified as acute or chronic: Secondary | ICD-10-CM | POA: Insufficient documentation

## 2013-07-13 DIAGNOSIS — K5900 Constipation, unspecified: Secondary | ICD-10-CM | POA: Insufficient documentation

## 2013-07-13 DIAGNOSIS — R509 Fever, unspecified: Secondary | ICD-10-CM | POA: Insufficient documentation

## 2013-07-13 DIAGNOSIS — Q613 Polycystic kidney, unspecified: Secondary | ICD-10-CM | POA: Insufficient documentation

## 2013-07-13 HISTORY — DX: Fibromyalgia: M79.7

## 2013-07-13 HISTORY — DX: Multiple sclerosis: G35

## 2013-07-13 LAB — COMPREHENSIVE METABOLIC PANEL
Albumin: 3 g/dL — ABNORMAL LOW (ref 3.5–5.2)
Alkaline Phosphatase: 186 U/L — ABNORMAL HIGH (ref 39–117)
BUN: 9 mg/dL (ref 6–23)
CO2: 27 mEq/L (ref 19–32)
Chloride: 101 mEq/L (ref 96–112)
Creatinine, Ser: 0.59 mg/dL (ref 0.50–1.10)
GFR calc Af Amer: >90 mL/min (ref >90)
GFR calc non Af Amer: >90 mL/min (ref >90)
Glucose, Bld: 90 mg/dL (ref 70–99)
Potassium: 4 mEq/L (ref 3.5–5.1)
Total Bilirubin: 0.1 mg/dL — ABNORMAL LOW (ref 0.3–1.2)

## 2013-07-13 LAB — URINALYSIS, ROUTINE W REFLEX MICROSCOPIC
Glucose, UA: NEGATIVE mg/dL
Ketones, ur: NEGATIVE mg/dL
Protein, ur: 30 mg/dL — AB
Urobilinogen, UA: 1 mg/dL (ref 0.0–1.0)

## 2013-07-13 LAB — CBC WITH DIFFERENTIAL/PLATELET
HCT: 33.3 % — ABNORMAL LOW (ref 36.0–46.0)
Hemoglobin: 11 g/dL — ABNORMAL LOW (ref 12.0–15.0)
Lymphs Abs: 2.9 10*3/uL (ref 0.7–4.0)
MCHC: 33 g/dL (ref 30.0–36.0)
Monocytes Absolute: 0.8 10*3/uL (ref 0.1–1.0)
Monocytes Relative: 7 % (ref 3–12)
Neutro Abs: 7.9 10*3/uL — ABNORMAL HIGH (ref 1.7–7.7)
Neutrophils Relative %: 67 % (ref 43–77)
RBC: 3.63 MIL/uL — ABNORMAL LOW (ref 3.87–5.11)

## 2013-07-13 LAB — URINE MICROSCOPIC-ADD ON

## 2013-07-13 LAB — WET PREP, GENITAL: Clue Cells Wet Prep HPF POC: NONE SEEN

## 2013-07-13 MED ORDER — ONDANSETRON HCL 4 MG/2ML IJ SOLN
4.0000 mg | Freq: Once | INTRAMUSCULAR | Status: AC
  Administered 2013-07-13: 4 mg via INTRAVENOUS
  Filled 2013-07-13: qty 2

## 2013-07-13 MED ORDER — MORPHINE SULFATE 2 MG/ML IJ SOLN
2.0000 mg | Freq: Once | INTRAMUSCULAR | Status: AC
  Administered 2013-07-13: 2 mg via INTRAVENOUS
  Filled 2013-07-13: qty 1

## 2013-07-13 MED ORDER — SODIUM CHLORIDE 0.9 % IV BOLUS (SEPSIS)
1000.0000 mL | Freq: Once | INTRAVENOUS | Status: AC
  Administered 2013-07-13: 1000 mL via INTRAVENOUS

## 2013-07-13 MED ORDER — CIPROFLOXACIN HCL 500 MG PO TABS: 500.0000 mg | ORAL_TABLET | Freq: Two times a day (BID) | ORAL | Status: DC

## 2013-07-13 MED ORDER — IOHEXOL 300 MG/ML  SOLN
50.0000 mL | Freq: Once | INTRAMUSCULAR | Status: AC | PRN
  Administered 2013-07-13: 50 mL via ORAL

## 2013-07-13 MED ORDER — METRONIDAZOLE 500 MG PO TABS: 500.0000 mg | ORAL_TABLET | Freq: Two times a day (BID) | ORAL | Status: DC

## 2013-07-13 MED ORDER — IOHEXOL 300 MG/ML  SOLN
100.0000 mL | Freq: Once | INTRAMUSCULAR | Status: AC | PRN
  Administered 2013-07-13: 100 mL via INTRAVENOUS

## 2013-07-13 MED ORDER — DEXTROSE 5 % IV SOLN
1.0000 g | Freq: Once | INTRAVENOUS | Status: AC
  Administered 2013-07-13: 1 g via INTRAVENOUS
  Filled 2013-07-13: qty 10

## 2013-07-13 NOTE — ED Provider Notes (Signed)
CSN: [id number]     Arrival date & time 07/13/13  1650 History     First MD Initiated Contact with Patient 07/13/13 1726     Chief Complaint  Patient presents with  . Abdominal Pain  . Emesis   (Consider location/radiation/quality/duration/timing/severity/associated sxs/prior Treatment) The history is provided by the patient. No language interpreter was used.  Hannah White is a 27 y/o F with past medical history of fibromyalgia and multiple sclerosis presenting to the emergency department with left-sided abdominal pain that has been ongoing for the past week, patient reported that the pain has gotten progressively worse over the past couple of days. Patient reported that pain was localized to the left flank but has now radiated to the abdomen, umbilical region. Patient described the discomfort as if being hit with a bat. Patient reported that she has been feeling nauseous, decreased appetite, reported having numerous episodes of vomiting-reported that today she had 2 episodes of emesis mainly of liquid consistency, denied blood and bile. Patient reported that she's been having at least 2 episodes of emesis per day. Patient reported that her last bowel movement was last week, patient is concerned because she normally has 2-3 bowel movements per day. Patient reported that she is burping and is passing flatulence. Patient reported that she is unable to keep any food down. Reported that she has a mild headache localized to the left side with associated dizziness. Patient reported that she's been having fever, reported that the highest fever recorded was 102F. Denied sudden loss of vision, blurred vision, chest pain, shortness of breath, difficulty breathing, changes or difficulty urinating, abdominal surgery, kidney stones, gallstones, vaginal bleeding, vaginal pain, vaginal discharge. PCP none  Past Medical History  Diagnosis Date  . Fibromyalgia   . MS (multiple sclerosis)    Past Surgical  History  Procedure Laterality Date  . Cesarean section     No family history on file. History  Substance Use Topics  . Smoking status: Current Every Day Smoker  . Smokeless tobacco: Not on file  . Alcohol Use: Yes     Comment: occ   OB History   Grav Para Term Preterm Abortions TAB SAB Ect Mult Living                 Review of Systems  Constitutional: Positive for fever. Negative for chills.  Eyes: Negative for visual disturbance.  Respiratory: Negative for chest tightness.   Gastrointestinal: Positive for nausea, vomiting, abdominal pain and constipation. Negative for diarrhea.  Genitourinary: Negative for dysuria, decreased urine volume, vaginal bleeding, vaginal discharge and vaginal pain.  Musculoskeletal: Positive for back pain. Negative for arthralgias.  Neurological: Positive for dizziness and headaches. Negative for seizures, weakness and numbness.  All other systems reviewed and are negative.    Allergies  Other  Home Medications   Current Outpatient Rx  Name  Route  Sig  Dispense  Refill  . ciprofloxacin (CIPRO) 500 MG tablet   Oral   Take 1 tablet (500 mg total) by mouth 2 (two) times daily.   14 tablet   0   . metroNIDAZOLE (FLAGYL) 500 MG tablet   Oral   Take 1 tablet (500 mg total) by mouth 2 (two) times daily. One po bid x 7 days   14 tablet   0    BP 114/69  Pulse 90  Temp(Src) 98.7 F (37.1 C) (Oral)  Resp 18  SpO2 100%  LMP 07/04/2013 Physical Exam  Nursing note and vitals  reviewed. Constitutional: She is oriented to person, place, and time. She appears well-developed and well-nourished. No distress.  HENT:  Head: Normocephalic and atraumatic.  Eyes: Conjunctivae and EOM are normal. Pupils are equal, round, and reactive to light. Right eye exhibits no discharge. Left eye exhibits no discharge.  Neck: Normal range of motion. Neck supple.  Negative neck stiffness Negative nuchal rigidity Negative pain upon palpation cervical spine   Cardiovascular: Regular rhythm and normal heart sounds.  Exam reveals no friction rub.   No murmur heard. Pulses:      Radial pulses are 2+ on the right side, and 2+ on the left side.       Dorsalis pedis pulses are 2+ on the right side, and 2+ on the left side.  Tachycardia noted  Pulmonary/Chest: Effort normal and breath sounds normal. No respiratory distress. She has no wheezes. She has no rales.  Abdominal: Soft. Bowel sounds are normal. She exhibits no distension. There is tenderness. There is no rebound and no guarding.  Negative Murphy's Negative McBurney sign Pain localize to the left lower quadrant of the abdomen, pain is worse when pressure applied Positive CVA tenderness to the left  Genitourinary: Vaginal discharge found.  Pelvic: Negative swelling, erythema, inflammation, lesions, sores, cyst formation to the external genitalia. Negative blood in the vaginal vault. Negative swelling, erythema, inflammation, sores, lesions noted to the vaginal canal. Thick white discharge noted from the cervical os. Negative erythema, inflammation, swelling, strawberry appearance the cervix. Negative CMT tenderness. Negative right adnexal tenderness mild discomfort upon left adnexal tenderness.   Musculoskeletal: Normal range of motion.  Lymphadenopathy:    She has no cervical adenopathy.  Neurological: She is alert and oriented to person, place, and time. No cranial nerve deficit. She exhibits normal muscle tone. Coordination normal.  Cranial nerves II through XII grossly intact Strength 5+/5+ with resistance to upper and lower extremities bilaterally  Skin: Skin is warm and dry. No rash noted. She is not diaphoretic. No erythema.  Psychiatric: She has a normal mood and affect. Her behavior is normal. Thought content normal.    ED Course   Procedures (including critical care time)  Medications  sodium chloride 0.9 % bolus 1,000 mL (0 mL Intravenous Stopped 07/13/13 2132)  morphine 2 MG/ML  injection 2 mg (2 mg Intravenous Given 07/13/13 1854)  ondansetron (ZOFRAN) injection 4 mg (4 mg Intravenous Given 07/13/13 1854)  iohexol (OMNIPAQUE) 300 MG/ML solution 50 mL (50 mL Oral Contrast Given 07/13/13 1940)  iohexol (OMNIPAQUE) 300 MG/ML solution 100 mL (100 mL Intravenous Contrast Given 07/13/13 2115)  cefTRIAXone (ROCEPHIN) 1 g in dextrose 5 % 50 mL IVPB (0 g Intravenous Stopped 07/13/13 2224)  sodium chloride 0.9 % bolus 1,000 mL (1,000 mL Intravenous New Bag/Given 07/13/13 2151)    Labs Reviewed  WET PREP, GENITAL - Abnormal; Notable for the following:    WBC, Wet Prep HPF POC MODERATE (*)    All other components within normal limits  CBC WITH DIFFERENTIAL - Abnormal; Notable for the following:    WBC 11.7 (*)    RBC 3.63 (*)    Hemoglobin 11.0 (*)    HCT 33.3 (*)    Neutro Abs 7.9 (*)    All other components within normal limits  COMPREHENSIVE METABOLIC PANEL - Abnormal; Notable for the following:    Albumin 3.0 (*)    AST 53 (*)    ALT 77 (*)    Alkaline Phosphatase 186 (*)    Total Bilirubin 0.1 (*)  All other components within normal limits  URINALYSIS, ROUTINE W REFLEX MICROSCOPIC - Abnormal; Notable for the following:    APPearance CLOUDY (*)    Hgb urine dipstick TRACE (*)    Protein, ur 30 (*)    Leukocytes, UA LARGE (*)    All other components within normal limits  URINE MICROSCOPIC-ADD ON - Abnormal; Notable for the following:    Squamous Epithelial / LPF FEW (*)    Bacteria, UA MANY (*)    All other components within normal limits  GC/CHLAMYDIA PROBE AMP  URINE CULTURE  LIPASE, BLOOD  LACTIC ACID, PLASMA  POCT PREGNANCY, URINE   Ct Abdomen Pelvis W Contrast  07/13/2013   *RADIOLOGY REPORT*  Clinical Data: Left-sided abdominal pain with nausea for 1 week  CT ABDOMEN AND PELVIS WITH CONTRAST  Technique:  Multidetector CT imaging of the abdomen and pelvis was performed following the standard protocol during bolus administration of intravenous contrast.   Contrast: OMNIPAQUE IOHEXOL 300 MG/ML  SOLN  Comparison: 04/14/2008  Findings: The kidneys are enlarged by numerous cysts, reflecting autosomal dominant polycystic kidney disease.  There are calcifications in the lower pole of the right kidney.  There is no hydronephrosis.  No ureteral stones are seen.  The bladder is unremarkable.  There is some hypoattenuation surrounding multiple cysts in the kidneys, particularly adjacent to the anterior mid pole cyst on the left.  Consider pyelonephritis in the proper clinical setting.  Clear lung bases.  The heart is normal in size.  There are tiny low density liver lesions were present previously also likely cysts. The liver is otherwise unremarkable.  Normal spleen.  Normal gallbladder.  No bile duct dilation.  Normal pancreas.  No adrenal masses.  The uterus and adnexa are unremarkable.  No adenopathy is seen.  There is no ascites.  The bowel is unremarkable.  A normal appendix is visualized.  No bony abnormality.  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 There is some hypoattenuation adjacent to several cysts, most prominently of one in the left kidney mid pole anteriorly.  Consider pyelonephritis in the proper clinical setting.  No ureteral stone or hydronephrosis.  No other evidence of acute abnormality.  A normal appendix is visualized.   Original Report Authenticated By: Amie Portland, M.D.   1. Pyelonephritis   2. Polycystic kidney disease   3. PID (acute pelvic inflammatory disease)     MDM  Patient presenting to the emergency department with localized left lower quadrant abdominal pain that has been ongoing for the past week and half with associated symptoms of nausea, vomiting, fever, fatigue, weakness. Alert and oriented. Pain localize the left lower quadrant upon palpation. Negative peritoneal signs, negative acute abdomen. Negative distention. Negative fluid wave noted. CMP with mild elevation of AST at 53, mild  elevation of ALT at 77, mild elevation of alkaline phosphatase at 186. CBC mild elevation of white cells, 11.7. Lactic acid negative elevation. Lipase negative elevation. Negative urine pregnancy test. Urine noted trace of hemoglobin, large leukocytes with white blood cell count ranging from 21-50, bacteria noted. CT abdomen pelvis with contrast due to polycystic kidney disease, mild attenuation to the left mid kidney pole that is some polyp of pyelonephritis urine, normal appendix. Polycystic kidney disease noted to CT scan, BUN and creatinine negative elevation. Patient placed on IV fluids and IV antibiotics for treatment of pyelonephritis. Patient stable, afebrile. Patient does not appear septic - mild elevation in WBC, but negative elevation in lactic acid, negative fever,  negative hypotension, negative tachycardia noted. Discussed case with Dr. Bebe Shaggy, cleared patient for discharge. Discharge patient with antibiotics. Referred patient to nephrologist for close followup regarding polycystic kidney disease-educated patient what polycystic kidney disease is consequences secondary to condition when kidney functioning not monitored. Referred patient to woman's outpatient clinic to be reevaluated. Discussed with patient to rest and stay hydrated. Discussed with patient use Tylenol to control fever if fever is to develop. Discussed with patient to continue to monitor symptoms and if symptoms are to worsen or change to report back to the emergency department immediately-strict return structures given. Patient agreed to plan of care, understood, all questions answered.  Raymon Mutton, PA-C 07/14/13 [phone]

## 2013-07-13 NOTE — ED Notes (Signed)
Pt is here with left mid abdominal pain that radiates around to back associated with vomiting and stool hard for one week

## 2013-07-14 LAB — GC/CHLAMYDIA PROBE AMP: GC Probe RNA: NEGATIVE

## 2013-07-15 LAB — URINE CULTURE: Colony Count: >=100000

## 2013-07-15 NOTE — ED Provider Notes (Signed)
Medical screening examination/treatment/procedure(s) were conducted as a shared visit with non-physician practitioner(s) and myself.  I personally evaluated the patient during the encounter  Pt stabilized in the ED and well appearing on my evaluation Will need followup as outpatient   Joya Gaskins, MD 07/15/13 1352

## 2013-07-16 NOTE — ED Notes (Signed)
+   Urine Culture- treated with appropriate medication per protocol MD. 

## 2014-12-15 ENCOUNTER — Ambulatory Visit (INDEPENDENT_AMBULATORY_CARE_PROVIDER_SITE_OTHER): Payer: Self-pay

## 2014-12-15 ENCOUNTER — Encounter (INDEPENDENT_AMBULATORY_CARE_PROVIDER_SITE_OTHER): Payer: Self-pay

## 2014-12-15 NOTE — Progress Notes (Signed)
Patient returned call. She is willing to make appointment if needed. She asked that I fax latest note per Dr. Tenna ChildHornsby. I faxed to [facility] at [phone]. Honor Lohonya Sue Teo Moede, LPN  0/98/11911/20/2016, 12:46

## 2014-12-15 NOTE — Telephone Encounter (Signed)
attempted to notify patient of need for appointment. No voice mail available. Honor Lohonya Sue Vy Badley, LPN  9/56/38751/20/2016, 12:40

## 2014-12-15 NOTE — Telephone Encounter (Signed)
-----   Message from Jinger NeighborsAlicia A Dalton sent at 12/13/2014 11:19 AM EST -----  >> ALICIA A DALTON 12/13/2014 11:19 AM  Dr.Hornsby the patient went [facility] [employer] [facility]. They are not giving her birth control until they received a not saying it is ok from you. The patient has not been seen since 2011.       [facility] Brown office phone [phone]  Fax [phone]

## 2016-02-24 ENCOUNTER — Encounter (INDEPENDENT_AMBULATORY_CARE_PROVIDER_SITE_OTHER): Payer: Self-pay

## 2016-02-24 NOTE — Progress Notes (Signed)
New patient packet received.    Truddie CrumbleAshley D Kirby Argueta, MA  02/24/2016, 07:47

## 2016-05-31 ENCOUNTER — Ambulatory Visit (INDEPENDENT_AMBULATORY_CARE_PROVIDER_SITE_OTHER): Payer: MEDICAID | Admitting: Rheumatology

## 2016-07-07 ENCOUNTER — Emergency Department (HOSPITAL_COMMUNITY)
Admission: EM | Admit: 2016-07-07 | Discharge: 2016-07-07 | Disposition: A | Discharge status: Home / Self Care | Payer: Self-pay | Attending: Emergency Medicine | Admitting: Emergency Medicine

## 2016-07-07 ENCOUNTER — Emergency Department (HOSPITAL_COMMUNITY): Payer: Self-pay

## 2016-07-07 ENCOUNTER — Encounter (HOSPITAL_COMMUNITY): Payer: Self-pay | Admitting: Emergency Medicine

## 2016-07-07 DIAGNOSIS — W19XXXA Unspecified fall, initial encounter: Secondary | ICD-10-CM

## 2016-07-07 DIAGNOSIS — F17200 Nicotine dependence, unspecified, uncomplicated: Secondary | ICD-10-CM | POA: Insufficient documentation

## 2016-07-07 DIAGNOSIS — W228XXA Striking against or struck by other objects, initial encounter: Secondary | ICD-10-CM | POA: Insufficient documentation

## 2016-07-07 DIAGNOSIS — Z7982 Long term (current) use of aspirin: Secondary | ICD-10-CM | POA: Insufficient documentation

## 2016-07-07 DIAGNOSIS — Y929 Unspecified place or not applicable: Secondary | ICD-10-CM | POA: Insufficient documentation

## 2016-07-07 DIAGNOSIS — S7001XA Contusion of right hip, initial encounter: Secondary | ICD-10-CM | POA: Insufficient documentation

## 2016-07-07 DIAGNOSIS — Y999 Unspecified external cause status: Secondary | ICD-10-CM | POA: Insufficient documentation

## 2016-07-07 DIAGNOSIS — Y9301 Activity, walking, marching and hiking: Secondary | ICD-10-CM | POA: Insufficient documentation

## 2016-07-07 DIAGNOSIS — S39012A Strain of muscle, fascia and tendon of lower back, initial encounter: Secondary | ICD-10-CM | POA: Insufficient documentation

## 2016-07-07 LAB — URINE MICROSCOPIC-ADD ON

## 2016-07-07 LAB — URINALYSIS, ROUTINE W REFLEX MICROSCOPIC
BILIRUBIN URINE: NEGATIVE
Glucose, UA: NEGATIVE mg/dL
KETONES UR: 15 mg/dL — AB
NITRITE: POSITIVE — AB
Protein, ur: NEGATIVE mg/dL
Specific Gravity, Urine: 1.019 (ref 1.005–1.030)
pH: 6 (ref 5.0–8.0)

## 2016-07-07 LAB — CBC WITH DIFFERENTIAL/PLATELET
BASOS PCT: 0 %
Basophils Absolute: 0 10*3/uL (ref 0.0–0.1)
EOS ABS: 0.1 10*3/uL (ref 0.0–0.7)
Eosinophils Relative: 1 %
HCT: 36.4 % (ref 36.0–46.0)
Hemoglobin: 11.3 g/dL — ABNORMAL LOW (ref 12.0–15.0)
Lymphocytes Relative: 35 %
Lymphs Abs: 2.6 10*3/uL (ref 0.7–4.0)
MCH: 29.4 pg (ref 26.0–34.0)
MCHC: 31 g/dL (ref 30.0–36.0)
MCV: 94.5 fL (ref 78.0–100.0)
MONO ABS: 0.3 10*3/uL (ref 0.1–1.0)
MONOS PCT: 4 %
Neutro Abs: 4.3 10*3/uL (ref 1.7–7.7)
Neutrophils Relative %: 60 %
Platelets: 295 10*3/uL (ref 150–400)
RBC: 3.85 MIL/uL — ABNORMAL LOW (ref 3.87–5.11)
RDW: 12.7 % (ref 11.5–15.5)
WBC: 7.2 10*3/uL (ref 4.0–10.5)

## 2016-07-07 LAB — BASIC METABOLIC PANEL
Anion gap: 8 (ref 5–15)
BUN: 10 mg/dL (ref 6–20)
CALCIUM: 9.5 mg/dL (ref 8.9–10.3)
CO2: 23 mmol/L (ref 22–32)
CREATININE: 0.64 mg/dL (ref 0.44–1.00)
Chloride: 107 mmol/L (ref 101–111)
GFR calc non Af Amer: >60 mL/min (ref >60)
GLUCOSE: 86 mg/dL (ref 65–99)
Potassium: 3.5 mmol/L (ref 3.5–5.1)
Sodium: 138 mmol/L (ref 135–145)

## 2016-07-07 LAB — PREGNANCY, URINE: PREG TEST UR: NEGATIVE

## 2016-07-07 MED ORDER — ONDANSETRON HCL 4 MG/2ML IJ SOLN
4.0000 mg | Freq: Once | INTRAMUSCULAR | Status: AC
  Administered 2016-07-07: 4 mg via INTRAVENOUS
  Filled 2016-07-07: qty 2

## 2016-07-07 MED ORDER — SULFAMETHOXAZOLE-TRIMETHOPRIM 800-160 MG PO TABS
1.0000 | ORAL_TABLET | Freq: Once | ORAL | Status: AC
  Administered 2016-07-07: 1 via ORAL
  Filled 2016-07-07: qty 1

## 2016-07-07 MED ORDER — HYDROCODONE-ACETAMINOPHEN 5-325 MG PO TABS: 1.0000 | ORAL_TABLET | ORAL | 0 refills | Status: DC | PRN

## 2016-07-07 MED ORDER — SODIUM CHLORIDE 0.9 % IV BOLUS (SEPSIS)
1000.0000 mL | Freq: Once | INTRAVENOUS | Status: AC
  Administered 2016-07-07: 1000 mL via INTRAVENOUS

## 2016-07-07 MED ORDER — MORPHINE SULFATE (PF) 4 MG/ML IV SOLN
4.0000 mg | Freq: Once | INTRAVENOUS | Status: AC
  Administered 2016-07-07: 4 mg via INTRAVENOUS
  Filled 2016-07-07: qty 1

## 2016-07-07 MED ORDER — SULFAMETHOXAZOLE-TRIMETHOPRIM 800-160 MG PO TABS: 1.0000 | ORAL_TABLET | Freq: Two times a day (BID) | ORAL | 0 refills | Status: AC

## 2016-07-07 MED ORDER — HYDROCODONE-ACETAMINOPHEN 5-325 MG PO TABS
1.0000 | ORAL_TABLET | Freq: Once | ORAL | Status: AC
Start: 2016-07-07 — End: 2016-07-07
  Administered 2016-07-07: 1 via ORAL
  Filled 2016-07-07: qty 1

## 2016-07-07 NOTE — ED Triage Notes (Signed)
Patient here with lower back, right hip, and bilateral leg pain after falling yesterday. States that her legs get weak and caused her to fall, no loc. Alert and oriented, ambulatory into triage

## 2016-07-07 NOTE — ED Provider Notes (Signed)
[facility] DEPT Provider Note   CSN: [id number] Arrival date & time: 07/07/16  1218  First Provider Contact:  None       History   Chief Complaint Chief Complaint  Patient presents with  . Fall    HPI Hannah White is a 30 y.o. female.  This is a 29 year old woman with a history of multiple sclerosis and fibromyalgia who presents for evaluation of a fall.  She was walking across [employer] yesterday when her legs suddenly felt weak and gave way, causing her to fall and hit her right hip and right face on the median.  She denies loss of consciousness.  She admits to falling in a similar fashion 2 times per months over the past year.  Patient reports losing continence of her urine during the fall, which has never happened before.  She isn't taking any medications for MS.  Patient now complains of worsening pain in her right hip, lower back, and the right side of her face.  She was able to ambulate to the ED.  She also endorses decreased PO intake over the past several days due to nausea and vomiting.  No nausea and vomiting today.  She admits to feeling light headed today.  Additionally, patient reports foul smelling urine and urinary frequency.  She denies dysuria, fever, abdominal pain, and bowel changes.        Past Medical History:  Diagnosis Date  . Fibromyalgia   . MS (multiple sclerosis) (HCC)     There are no active problems to display for this patient.   Past Surgical History:  Procedure Laterality Date  . CESAREAN SECTION      OB History    No data available       Home Medications    Prior to Admission medications   Medication Sig Start Date End Date Taking? Authorizing Provider  aspirin-acetaminophen-caffeine (EXCEDRIN MIGRAINE) [phone] MG tablet Take 1 tablet by mouth every 6 (six) hours as needed for headache.   Yes Historical Provider, MD  Aspirin-Acetaminophen-Caffeine (GOODY HEADACHE PO) Take 1 each by mouth as needed (for headache).   Yes  Historical Provider, MD  Menthol, Topical Analgesic, (ICY HOT EX) Apply 1 application topically daily.   Yes Historical Provider, MD  naproxen sodium (ANAPROX) 220 MG tablet Take 440 mg by mouth as needed (for headache).   Yes Historical Provider, MD  ciprofloxacin (CIPRO) 500 MG tablet Take 1 tablet (500 mg total) by mouth 2 (two) times daily. Patient not taking: Reported on 07/07/2016 07/13/13   Marissa Sciacca, PA-C  HYDROcodone-acetaminophen (NORCO/VICODIN) 5-325 MG tablet Take 1 tablet by mouth every 4 (four) hours as needed. 07/07/16   Jacalyn Lefevre, MD  metroNIDAZOLE (FLAGYL) 500 MG tablet Take 1 tablet (500 mg total) by mouth 2 (two) times daily. One po bid x 7 days Patient not taking: Reported on 07/07/2016 07/13/13   Marissa Sciacca, PA-C  sulfamethoxazole-trimethoprim (BACTRIM DS,SEPTRA DS) 800-160 MG tablet Take 1 tablet by mouth 2 (two) times daily. 07/07/16 07/14/16  Jacalyn Lefevre, MD    Family History No family history on file.  Social History Social History  Substance Use Topics  . Smoking status: Current Every Day Smoker  . Smokeless tobacco: Never Used  . Alcohol use Yes     Comment: occ     Allergies   Other   Review of Systems Review of Systems  Constitutional: Positive for appetite change and fatigue. Negative for fever.  HENT: Positive for sinus pressure. Negative  for congestion and rhinorrhea.   Eyes: Positive for visual disturbance (blurred vision).  Respiratory: Negative for chest tightness and shortness of breath.   Cardiovascular: Negative for chest pain.  Gastrointestinal: Positive for nausea and vomiting. Negative for abdominal distention, abdominal pain, blood in stool, constipation and diarrhea.  Endocrine: Negative.   Genitourinary: Positive for frequency. Negative for difficulty urinating, dysuria and hematuria.  Musculoskeletal: Positive for arthralgias, back pain and myalgias.  Skin: Negative for wound.  Allergic/Immunologic: Negative.     Neurological: Positive for light-headedness, numbness and headaches. Negative for dizziness, syncope and weakness.  Hematological: Negative.   Psychiatric/Behavioral: Negative.      Physical Exam Updated Vital Signs BP 118/76 (BP Location: Right Arm)   Pulse 73   Temp 98.6 F (37 C) (Oral)   Resp 16   Ht 5' (1.524 m)   Wt 121 lb 1 oz (54.9 kg)   LMP 07/03/2016   SpO2 98%   BMI 23.64 kg/m   Physical Exam  Constitutional: She is oriented to person, place, and time. She appears well-developed and well-nourished. No distress.  HENT:  Head: Normocephalic and atraumatic.  Eyes: EOM are normal. Pupils are equal, round, and reactive to light.  Neck: Normal range of motion. Neck supple.  Cardiovascular: Normal rate, regular rhythm, normal heart sounds and intact distal pulses.   No murmur heard. Pulmonary/Chest: Effort normal and breath sounds normal. No respiratory distress. She has no rales.  Abdominal: Soft. Bowel sounds are normal. She exhibits no distension. There is no tenderness.  Musculoskeletal: Normal range of motion.       Right hip: She exhibits tenderness. She exhibits no deformity and no laceration.       Lumbar back: She exhibits tenderness, pain and spasm. She exhibits no bony tenderness and no deformity.  Neurological: She is alert and oriented to person, place, and time. She has normal reflexes. No cranial nerve deficit or sensory deficit.  4+/5 strength throughout her bilateral upper and lower extremities  Skin: Skin is warm and dry. No abrasion, no bruising and no ecchymosis noted.  Psychiatric: She has a normal mood and affect. Her speech is normal.     ED Treatments / Results  Labs (all labs ordered are listed, but only abnormal results are displayed) Labs Reviewed  CBC WITH DIFFERENTIAL/PLATELET - Abnormal; Notable for the following:       Result Value   RBC 3.85 (*)    Hemoglobin 11.3 (*)    All other components within normal limits  BASIC METABOLIC  PANEL  URINALYSIS, ROUTINE W REFLEX MICROSCOPIC (NOT AT [facility])  PREGNANCY, URINE    EKG  EKG Interpretation None       Radiology Dg Lumbar Spine Complete  Result Date: 07/07/2016 CLINICAL DATA:  Fall yesterday onto cement; Pain in right hip and lower back EXAM: LUMBAR SPINE - COMPLETE 4+ VIEW COMPARISON:  None. FINDINGS: There is no evidence of lumbar spine fracture. Alignment is normal. Intervertebral disc spaces are maintained. IMPRESSION: Negative. Electronically Signed   By: Amie Portland M.D.   On: 07/07/2016 15:10   Dg Hip Unilat W Or Wo Pelvis 2-3 Views Right  Result Date: 07/07/2016 CLINICAL DATA:  Fall yesterday onto cement; Pain in right hip and lower back EXAM: DG HIP (WITH OR WITHOUT PELVIS) 2-3V RIGHT COMPARISON:  None. FINDINGS: There is no evidence of hip fracture or dislocation. There is no evidence of arthropathy or other focal bone abnormality. IMPRESSION: Negative. Electronically Signed   By: Amie Portland  M.D.   On: 07/07/2016 15:10    Procedures Procedures (including critical care time)  Medications Ordered in ED Medications  HYDROcodone-acetaminophen (NORCO/VICODIN) 5-325 MG per tablet 1 tablet (not administered)  sulfamethoxazole-trimethoprim (BACTRIM DS,SEPTRA DS) 800-160 MG per tablet 1 tablet (not administered)  sodium chloride 0.9 % bolus 1,000 mL (0 mLs Intravenous Stopped 07/07/16 1514)  ondansetron (ZOFRAN) injection 4 mg (4 mg Intravenous Given 07/07/16 1352)  morphine 4 MG/ML injection 4 mg (4 mg Intravenous Given 07/07/16 1354)     Initial Impression / Assessment and Plan / ED Course  I have reviewed the triage vital signs and the nursing notes.  Pertinent labs & imaging results that were available during my care of the patient were reviewed by me and considered in my medical decision making (see chart for details).  Clinical Course    Pt is improving.  She knows to return if worse.  Final Clinical Impressions(s) / ED Diagnoses   Final  diagnoses:  Fall, initial encounter  Contusion of right hip, initial encounter  Lumbar strain, initial encounter    New Prescriptions New Prescriptions   HYDROCODONE-ACETAMINOPHEN (NORCO/VICODIN) 5-325 MG TABLET    Take 1 tablet by mouth every 4 (four) hours as needed.   SULFAMETHOXAZOLE-TRIMETHOPRIM (BACTRIM DS,SEPTRA DS) 800-160 MG TABLET    Take 1 tablet by mouth 2 (two) times daily.     Jacalyn Lefevre, MD 07/07/16 [phone]

## 2016-07-07 NOTE — ED Notes (Signed)
Pt placed in the room. Changing and waiting for MD

## 2016-07-10 ENCOUNTER — Ambulatory Visit: Payer: Self-pay | Admitting: Neurology

## 2016-07-26 ENCOUNTER — Encounter: Payer: Self-pay | Admitting: Neurology

## 2016-07-26 ENCOUNTER — Ambulatory Visit (INDEPENDENT_AMBULATORY_CARE_PROVIDER_SITE_OTHER): Payer: Self-pay | Admitting: Neurology

## 2016-07-26 DIAGNOSIS — G4700 Insomnia, unspecified: Secondary | ICD-10-CM

## 2016-07-26 DIAGNOSIS — F418 Other specified anxiety disorders: Secondary | ICD-10-CM

## 2016-07-26 DIAGNOSIS — G35 Multiple sclerosis: Secondary | ICD-10-CM

## 2016-07-26 DIAGNOSIS — R269 Unspecified abnormalities of gait and mobility: Secondary | ICD-10-CM

## 2016-07-26 DIAGNOSIS — R200 Anesthesia of skin: Secondary | ICD-10-CM

## 2016-07-26 DIAGNOSIS — G935 Compression of brain: Secondary | ICD-10-CM | POA: Insufficient documentation

## 2016-07-26 MED ORDER — SERTRALINE HCL 50 MG PO TABS: 50.0000 mg | ORAL_TABLET | Freq: Every day | ORAL | 11 refills | Status: DC

## 2016-07-26 MED ORDER — IMIPRAMINE HCL 25 MG PO TABS: 25.0000 mg | ORAL_TABLET | Freq: Every day | ORAL | 5 refills | Status: DC

## 2016-07-26 NOTE — Progress Notes (Signed)
note
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
## 2016-08-07 ENCOUNTER — Telehealth: Payer: Self-pay | Admitting: Neurology

## 2016-08-07 NOTE — Telephone Encounter (Signed)
I have spoken with Hannah White this afternoon and advised that we do not charge a form fee for the MS Society form for financial assistance to be completed.  She will drop it by the office for me/fim

## 2016-08-07 NOTE — Telephone Encounter (Signed)
Pt has MS[employer] form that needs to be filled out in order for MRI to be paid for. She does not have any money for form fee. Please call [phone]

## 2016-08-14 ENCOUNTER — Telehealth: Payer: Self-pay | Admitting: *Deleted

## 2016-08-14 NOTE — Telephone Encounter (Signed)
Pt form faxed to [facility] [employer] [facility] on 08/14/16.

## 2016-09-04 ENCOUNTER — Encounter (HOSPITAL_COMMUNITY): Payer: Self-pay | Admitting: Emergency Medicine

## 2016-09-04 ENCOUNTER — Emergency Department (HOSPITAL_COMMUNITY)
Admission: EM | Admit: 2016-09-04 | Discharge: 2016-09-04 | Disposition: A | Discharge status: Home / Self Care | Payer: Self-pay | Attending: Dermatology | Admitting: Dermatology

## 2016-09-04 DIAGNOSIS — M549 Dorsalgia, unspecified: Secondary | ICD-10-CM | POA: Insufficient documentation

## 2016-09-04 DIAGNOSIS — Z5321 Procedure and treatment not carried out due to patient leaving prior to being seen by health care provider: Secondary | ICD-10-CM | POA: Insufficient documentation

## 2016-09-04 NOTE — ED Notes (Signed)
No response for vital sign reassessment.

## 2016-09-04 NOTE — ED Triage Notes (Signed)
Pt sts generalized back pain and HA

## 2016-09-26 ENCOUNTER — Ambulatory Visit (INDEPENDENT_AMBULATORY_CARE_PROVIDER_SITE_OTHER): Payer: Self-pay

## 2016-09-26 ENCOUNTER — Ambulatory Visit: Payer: Self-pay | Admitting: Neurology

## 2016-09-26 DIAGNOSIS — Z0289 Encounter for other administrative examinations: Secondary | ICD-10-CM

## 2016-09-26 DIAGNOSIS — R269 Unspecified abnormalities of gait and mobility: Secondary | ICD-10-CM

## 2016-09-26 DIAGNOSIS — R200 Anesthesia of skin: Secondary | ICD-10-CM

## 2016-09-28 ENCOUNTER — Telehealth: Payer: Self-pay | Admitting: Neurology

## 2016-09-28 DIAGNOSIS — G935 Compression of brain: Secondary | ICD-10-CM

## 2016-09-28 NOTE — Telephone Encounter (Signed)
I called and got voicemail A message was left.  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 We will try to return next week.

## 2016-10-01 NOTE — Telephone Encounter (Signed)
I called this afternoon. The phone was busy. I will try to reach her again later in the day or tomorrow.

## 2016-10-04 NOTE — Telephone Encounter (Signed)
I tried to call her again and got her voice mail and left a message.  Phone calls were made on 3 different days.  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   Faith: You please try to call her again on Friday and get her MRI of the cervical spine scheduled if you are able to reach her. She does have an appointment to see me 10/17/2016 and I will discuss this further at that visit.

## 2016-10-05 ENCOUNTER — Encounter: Payer: Self-pay | Admitting: *Deleted

## 2016-10-05 NOTE — Telephone Encounter (Signed)
Patient returned Faith's call, please call [phone].

## 2016-10-05 NOTE — Telephone Encounter (Signed)
I have spoken with Hannah White this morning and reviewed MRI brain results with her.  Explained that RAS would like her to have an MRI c-spine as well to r/o syrinx.  She verbalized understanding of same, sts. she is not able to afford another mri.  I have provided phone# for [facility] pt. accounting so she can apply for pt. assistance/charity care/fim

## 2016-10-05 NOTE — Addendum Note (Signed)
Addended by: Candis Schatz I on: 10/05/2016 09:35 AM   Modules accepted: Orders

## 2016-10-05 NOTE — Telephone Encounter (Signed)
LMTC.  As Dr. Epimenio Foot has already made multiple attempts to reach her, I went ahead and mailed an unable to contact letter to her home address on file as well/fim

## 2016-10-17 ENCOUNTER — Ambulatory Visit (INDEPENDENT_AMBULATORY_CARE_PROVIDER_SITE_OTHER): Payer: Self-pay | Admitting: Neurology

## 2016-10-17 ENCOUNTER — Encounter: Payer: Self-pay | Admitting: Neurology

## 2016-10-17 VITALS — BP 116/68 | HR 78 | Ht 60.0 in | Wt 122.0 lb

## 2016-10-17 DIAGNOSIS — G4700 Insomnia, unspecified: Secondary | ICD-10-CM

## 2016-10-17 DIAGNOSIS — R200 Anesthesia of skin: Secondary | ICD-10-CM

## 2016-10-17 DIAGNOSIS — G935 Compression of brain: Secondary | ICD-10-CM

## 2016-10-17 DIAGNOSIS — F418 Other specified anxiety disorders: Secondary | ICD-10-CM

## 2016-10-17 MED ORDER — TRAZODONE HCL 50 MG PO TABS: 50.0000 mg | ORAL_TABLET | Freq: Every day | ORAL | 5 refills | Status: DC
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
## 2016-11-03 ENCOUNTER — Inpatient Hospital Stay: Admission: RE | Admit: 2016-11-03 | Payer: Self-pay | Source: Ambulatory Visit

## 2016-11-10 ENCOUNTER — Inpatient Hospital Stay: Admission: RE | Admit: 2016-11-10 | Payer: Self-pay | Source: Ambulatory Visit

## 2017-01-10 DIAGNOSIS — Z0289 Encounter for other administrative examinations: Secondary | ICD-10-CM

## 2017-02-19 ENCOUNTER — Emergency Department (HOSPITAL_COMMUNITY)
Admission: EM | Admit: 2017-02-19 | Discharge: 2017-02-19 | Disposition: A | Discharge status: Home / Self Care | Payer: Medicaid Other | Attending: Emergency Medicine | Admitting: Emergency Medicine

## 2017-02-19 ENCOUNTER — Encounter (HOSPITAL_COMMUNITY): Payer: Self-pay

## 2017-02-19 ENCOUNTER — Emergency Department (HOSPITAL_COMMUNITY): Payer: Medicaid Other

## 2017-02-19 DIAGNOSIS — F17200 Nicotine dependence, unspecified, uncomplicated: Secondary | ICD-10-CM | POA: Diagnosis not present

## 2017-02-19 DIAGNOSIS — Z7982 Long term (current) use of aspirin: Secondary | ICD-10-CM | POA: Insufficient documentation

## 2017-02-19 DIAGNOSIS — M549 Dorsalgia, unspecified: Secondary | ICD-10-CM | POA: Diagnosis present

## 2017-02-19 DIAGNOSIS — Z79899 Other long term (current) drug therapy: Secondary | ICD-10-CM | POA: Insufficient documentation

## 2017-02-19 DIAGNOSIS — N200 Calculus of kidney: Secondary | ICD-10-CM | POA: Diagnosis not present

## 2017-02-19 LAB — URINALYSIS, ROUTINE W REFLEX MICROSCOPIC
BILIRUBIN URINE: NEGATIVE
Glucose, UA: NEGATIVE mg/dL
Ketones, ur: NEGATIVE mg/dL
NITRITE: NEGATIVE
PH: 7 (ref 5.0–8.0)
Protein, ur: NEGATIVE mg/dL
SPECIFIC GRAVITY, URINE: 1.01 (ref 1.005–1.030)

## 2017-02-19 LAB — I-STAT CHEM 8, ED
BUN: 6 mg/dL (ref 6–20)
CHLORIDE: 109 mmol/L (ref 101–111)
CREATININE: 0.6 mg/dL (ref 0.44–1.00)
Calcium, Ion: 1.2 mmol/L (ref 1.15–1.40)
Glucose, Bld: 88 mg/dL (ref 65–99)
HEMATOCRIT: 37 % (ref 36.0–46.0)
Hemoglobin: 12.6 g/dL (ref 12.0–15.0)
POTASSIUM: 3.3 mmol/L — AB (ref 3.5–5.1)
SODIUM: 140 mmol/L (ref 135–145)
TCO2: 23 mmol/L (ref 0–100)

## 2017-02-19 LAB — URINALYSIS, MICROSCOPIC (REFLEX)

## 2017-02-19 LAB — POC URINE PREG, ED: Preg Test, Ur: NEGATIVE

## 2017-02-19 MED ORDER — OXYCODONE HCL 5 MG PO TABS
5.0000 mg | ORAL_TABLET | Freq: Once | ORAL | Status: AC
  Administered 2017-02-19: 5 mg via ORAL
  Filled 2017-02-19: qty 1

## 2017-02-19 MED ORDER — OXYCODONE-ACETAMINOPHEN 5-325 MG PO TABS: 1.0000 | ORAL_TABLET | Freq: Four times a day (QID) | ORAL | 0 refills | Status: DC | PRN

## 2017-02-19 MED ORDER — TAMSULOSIN HCL 0.4 MG PO CAPS: 0.4000 mg | ORAL_CAPSULE | Freq: Every day | ORAL | 0 refills | Status: AC

## 2017-02-19 MED ORDER — ONDANSETRON 4 MG PO TBDP
4.0000 mg | ORAL_TABLET | Freq: Once | ORAL | Status: AC
  Administered 2017-02-19: 4 mg via ORAL
  Filled 2017-02-19: qty 1

## 2017-02-19 MED ORDER — ONDANSETRON 4 MG PO TBDP: ORAL_TABLET | ORAL | 0 refills | Status: AC

## 2017-02-19 MED ORDER — ACETAMINOPHEN 500 MG PO TABS
ORAL_TABLET | ORAL | Status: AC
  Filled 2017-02-19: qty 1

## 2017-02-19 NOTE — ED Provider Notes (Signed)
[facility]-EMERGENCY DEPT Provider Note   CSN: [id number] Arrival date & time: 02/19/17  1616   By signing my name below, I, Clovis Pu, attest that this documentation has been prepared under the direction and in the presence of  Felicie Morn, NP. Electronically Signed: Clovis Pu, ED Scribe. 02/19/17. 5:55 PM.   History   Chief Complaint Chief Complaint  Patient presents with  . Back Pain    The history is provided by the patient. No language interpreter was used.  Back Pain   This is a new problem. The current episode started 2 days ago. The problem occurs constantly. The problem has not changed since onset.The pain is associated with no known injury. The pain is moderate. The symptoms are aggravated by certain positions. Associated symptoms include a fever (subjective ). Pertinent negatives include no numbness.   HPI Comments:  Hannah White is a 31 y.o. female, with a PMHx of multiple sclerosis and fibromyalgia, who presents to the Emergency Department complaining of worsened back pain x 2 days. Her pain radiates down her bilateral lower extremities. Her pain is worse upon palpation and with movement. She also reports generalized body aches, subjective fever, nausea and 1 episode of vomiting. No alleviating factors noted. Pt denies any recent known sick contacts and any other associated symptoms. No other complaints noted.   Past Medical History:  Diagnosis Date  . Fibromyalgia   . MS (multiple sclerosis) [facility])     Patient Active Problem List   Diagnosis Date Noted  . Numbness 07/26/2016  . Gait disturbance 07/26/2016  . Insomnia 07/26/2016  . Depression with anxiety 07/26/2016  . Chiari malformation type I (HCC) 07/26/2016    Past Surgical History:  Procedure Laterality Date  . CESAREAN SECTION      OB History    No data available       Home Medications    Prior to Admission medications   Medication Sig Start Date End Date Taking? Authorizing Provider    aspirin-acetaminophen-caffeine (EXCEDRIN MIGRAINE) [phone] MG tablet Take 1 tablet by mouth every 6 (six) hours as needed for headache.    Historical Provider, MD  Aspirin-Acetaminophen-Caffeine (GOODY HEADACHE PO) Take 1 each by mouth as needed (for headache).    Historical Provider, MD  HYDROcodone-acetaminophen (NORCO/VICODIN) 5-325 MG tablet Take 1 tablet by mouth every 4 (four) hours as needed. 07/07/16   Jacalyn Lefevre, MD  imipramine (TOFRANIL) 25 MG tablet Take 1 tablet (25 mg total) by mouth at bedtime. 07/26/16   Asa Lente, MD  Menthol, Topical Analgesic, (ICY HOT EX) Apply 1 application topically daily.    Historical Provider, MD  naproxen sodium (ANAPROX) 220 MG tablet Take 440 mg by mouth as needed (for headache).    Historical Provider, MD  sertraline (ZOLOFT) 50 MG tablet Take 1 tablet (50 mg total) by mouth daily. 07/26/16   Asa Lente, MD  traZODone (DESYREL) 50 MG tablet Take 1 tablet (50 mg total) by mouth at bedtime. 10/17/16   Asa Lente, MD    Family History Family History  Problem Relation Age of Onset  . Depression Mother   . Hypertension Mother   . Bipolar disorder Mother   . Gout Father   . Hypertension Father     Social History Social History  Substance Use Topics  . Smoking status: Current Every Day Smoker  . Smokeless tobacco: Never Used  . Alcohol use Yes     Comment: occ     Allergies  Other   Review of Systems Review of Systems  Constitutional: Positive for fever (subjective ).  Gastrointestinal: Positive for nausea and vomiting.  Musculoskeletal: Positive for back pain and myalgias.  Neurological: Negative for numbness.  All other systems reviewed and are negative.    Physical Exam Updated Vital Signs BP (!) 142/91 (BP Location: Left Arm)   Pulse 78   Temp 98.5 F (36.9 C) (Oral)   Resp 17   Ht 5\' 6"  (1.676 m)   Wt 125 lb (56.7 kg)   LMP 02/16/2017   SpO2 100%   BMI 20.18 kg/m   Physical Exam   Constitutional: She is oriented to person, place, and time. She appears well-developed and well-nourished. No distress.  HENT:  Head: Normocephalic and atraumatic.  Eyes: EOM are normal.  Neck: Normal range of motion.  Cardiovascular: Normal rate, regular rhythm and normal heart sounds.   Pulmonary/Chest: Effort normal and breath sounds normal.  Abdominal: Soft. She exhibits no distension. There is no tenderness.  Musculoskeletal: She exhibits tenderness.  Upper lumbar spine tenderness upon palpation. No strength deficit to extremities.   Neurological: She is alert and oriented to person, place, and time.  Skin: Skin is warm and dry.  Psychiatric: She has a normal mood and affect. Judgment normal.  Nursing note and vitals reviewed.    ED Treatments / Results  DIAGNOSTIC STUDIES:  Oxygen Saturation is 100% on RA, normal by my interpretation.    COORDINATION OF CARE:  5:50 PM Discussed treatment plan with pt at bedside and pt agreed to plan.  Labs (all labs ordered are listed, but only abnormal results are displayed) Labs Reviewed  URINALYSIS, ROUTINE W REFLEX MICROSCOPIC - Abnormal; Notable for the following:       Result Value   Hgb urine dipstick MODERATE (*)    Leukocytes, UA TRACE (*)    All other components within normal limits  URINALYSIS, MICROSCOPIC (REFLEX) - Abnormal; Notable for the following:    Bacteria, UA RARE (*)    Squamous Epithelial / LPF 0-5 (*)    All other components within normal limits  I-STAT CHEM 8, ED - Abnormal; Notable for the following:    Potassium 3.3 (*)    All other components within normal limits  POC URINE PREG, ED    EKG  EKG Interpretation None       Radiology Dg Lumbar Spine Complete  Result Date: 02/19/2017 CLINICAL DATA:  Chronic lower back pain, radiating to both legs. Initial encounter. EXAM: LUMBAR SPINE - COMPLETE 4+ VIEW COMPARISON:  Lumbar spine radiographs performed 07/07/2016 FINDINGS: There is no evidence of  fracture or subluxation. Vertebral bodies demonstrate normal height and alignment. Intervertebral disc spaces are preserved. The visualized neural foramina are grossly unremarkable in appearance. An apparent 8 mm nonobstructing stone is noted at the lower pole of the right renal shadow. The visualized bowel gas pattern is unremarkable in appearance; air and stool are noted within the colon. The sacroiliac joints are within normal limits. IMPRESSION: 1. No evidence of fracture or subluxation along the lumbar spine. 2. Apparent 8 mm nonobstructing stone at the lower pole of the right renal shadow. Electronically Signed   By: Roanna Raider M.D.   On: 02/19/2017 19:34    Procedures Procedures (including critical care time)  Medications Ordered in ED Medications  acetaminophen (TYLENOL) 500 MG tablet (not administered)  oxyCODONE (Oxy IR/ROXICODONE) immediate release tablet 5 mg (5 mg Oral Given 02/19/17 2007)  ondansetron (ZOFRAN-ODT) disintegrating tablet 4 mg (  4 mg Oral Given 02/19/17 2155)     Initial Impression / Assessment and Plan / ED Course  I have reviewed the triage vital signs and the nursing notes.  Pertinent labs & imaging results that were available during my care of the patient were reviewed by me and considered in my medical decision making (see chart for details).     Pain improved with medication.  Patient with back pain.  No neurological deficits and normal neuro exam.  Patient is ambulatory.  No loss of bowel or bladder control.  No concern for cauda equina.  No fever, night sweats, weight loss, h/o cancer, IVDA, no recent procedure to back. Supportive care and return precaution discussed. Appears safe for discharge at this time. Follow up as indicated in discharge paperwork.   Pt has been diagnosed with a Kidney Stone. There is no evidence of significant hydronephrosis, serum creatine WNL, vitals sign stable and the pt does not have irratractable vomiting. Pt will be dc home  with pain medications & has been advised to follow up with PCP.   Final Clinical Impressions(s) / ED Diagnoses   Final diagnoses:  Kidney stone on right side    New Prescriptions Discharge Medication List as of 02/19/2017  9:45 PM    START taking these medications   Details  ondansetron (ZOFRAN ODT) 4 MG disintegrating tablet 4mg  ODT q4 hours prn nausea/vomit, Print    oxyCODONE-acetaminophen (PERCOCET/ROXICET) 5-325 MG tablet Take 1 tablet by mouth every 6 (six) hours as needed for severe pain., Starting Tue 02/19/2017, Print    tamsulosin (FLOMAX) 0.4 MG CAPS capsule Take 1 capsule (0.4 mg total) by mouth daily., Starting Tue 02/19/2017, Print       I personally performed the services described in this documentation, which was scribed in my presence. The recorded information has been reviewed and is accurate.     Felicie Morn, NP 02/19/17 4920    Canary Brim Tegeler, MD 02/20/17 [phone]

## 2017-02-19 NOTE — ED Triage Notes (Signed)
Pt presents to the ed today for back pain, she has been having back pain for years now but today she started having new back pain that stars in her neck and goes all the way down to her legs, ambulatory with a steady gait.

## 2017-03-04 ENCOUNTER — Other Ambulatory Visit: Payer: Self-pay

## 2017-04-14 ENCOUNTER — Encounter (HOSPITAL_COMMUNITY): Payer: Self-pay | Admitting: Emergency Medicine

## 2017-04-14 ENCOUNTER — Emergency Department (HOSPITAL_COMMUNITY): Payer: Medicaid Other

## 2017-04-14 ENCOUNTER — Emergency Department (HOSPITAL_COMMUNITY)
Admission: EM | Admit: 2017-04-14 | Discharge: 2017-04-14 | Disposition: A | Discharge status: Home / Self Care | Payer: Medicaid Other | Attending: Emergency Medicine | Admitting: Emergency Medicine

## 2017-04-14 DIAGNOSIS — Z23 Encounter for immunization: Secondary | ICD-10-CM | POA: Diagnosis not present

## 2017-04-14 DIAGNOSIS — Y929 Unspecified place or not applicable: Secondary | ICD-10-CM | POA: Insufficient documentation

## 2017-04-14 DIAGNOSIS — W450XXA Nail entering through skin, initial encounter: Secondary | ICD-10-CM | POA: Diagnosis not present

## 2017-04-14 DIAGNOSIS — S91331A Puncture wound without foreign body, right foot, initial encounter: Secondary | ICD-10-CM | POA: Diagnosis not present

## 2017-04-14 DIAGNOSIS — Z7982 Long term (current) use of aspirin: Secondary | ICD-10-CM | POA: Insufficient documentation

## 2017-04-14 DIAGNOSIS — Y939 Activity, unspecified: Secondary | ICD-10-CM | POA: Insufficient documentation

## 2017-04-14 DIAGNOSIS — F17290 Nicotine dependence, other tobacco product, uncomplicated: Secondary | ICD-10-CM | POA: Diagnosis not present

## 2017-04-14 DIAGNOSIS — Z79899 Other long term (current) drug therapy: Secondary | ICD-10-CM | POA: Diagnosis not present

## 2017-04-14 DIAGNOSIS — Y999 Unspecified external cause status: Secondary | ICD-10-CM | POA: Insufficient documentation

## 2017-04-14 MED ORDER — TETANUS-DIPHTH-ACELL PERTUSSIS 5-2.5-18.5 LF-MCG/0.5 IM SUSP
0.5000 mL | Freq: Once | INTRAMUSCULAR | Status: AC
  Administered 2017-04-14: 0.5 mL via INTRAMUSCULAR
  Filled 2017-04-14: qty 0.5

## 2017-04-14 MED ORDER — ACETAMINOPHEN 500 MG PO TABS
1000.0000 mg | ORAL_TABLET | Freq: Once | ORAL | Status: AC
  Administered 2017-04-14: 1000 mg via ORAL
  Filled 2017-04-14: qty 2

## 2017-04-14 MED ORDER — CIPROFLOXACIN HCL 500 MG PO TABS: 500.0000 mg | ORAL_TABLET | Freq: Two times a day (BID) | ORAL | 0 refills | Status: DC

## 2017-04-14 MED ORDER — CIPROFLOXACIN HCL 500 MG PO TABS
500.0000 mg | ORAL_TABLET | Freq: Once | ORAL | Status: AC
  Administered 2017-04-14: 500 mg via ORAL
  Filled 2017-04-14: qty 1

## 2017-04-14 NOTE — ED Provider Notes (Signed)
[facility]-EMERGENCY DEPT Provider Note   CSN: [id number] Arrival date & time: 04/14/17  0814     History   Chief Complaint Chief Complaint  Patient presents with  . stepped on nail    HPI Hannah White is a 31 y.o. female.  HPI  Patient presents after stepping on nail. Patient was in her usual state of health, walking on her back porch when she stepped on a nail. She removed the nail, and noticed that it was rusty. Since that time she has had pain in the plantar surface of the distal right foot, with foreign-body sensation. No distal loss of sensation, strength, inability to move the foot or ankle. No other injuries, no other complaints.   Past Medical History:  Diagnosis Date  . Fibromyalgia   . MS (multiple sclerosis) [facility])     Patient Active Problem List   Diagnosis Date Noted  . Numbness 07/26/2016  . Gait disturbance 07/26/2016  . Insomnia 07/26/2016  . Depression with anxiety 07/26/2016  . Chiari malformation type I (HCC) 07/26/2016    Past Surgical History:  Procedure Laterality Date  . CESAREAN SECTION      OB History    No data available       Home Medications    Prior to Admission medications   Medication Sig Start Date End Date Taking? Authorizing Provider  aspirin-acetaminophen-caffeine (EXCEDRIN MIGRAINE) [phone] MG tablet Take 1 tablet by mouth every 6 (six) hours as needed for headache.    [provider]  Aspirin-Acetaminophen-Caffeine (GOODY HEADACHE PO) Take 1 each by mouth as needed (for headache).    [provider]  ciprofloxacin (CIPRO) 500 MG tablet Take 1 tablet (500 mg total) by mouth 2 (two) times daily. 04/14/17   Gerhard Munch, MD  HYDROcodone-acetaminophen (NORCO/VICODIN) 5-325 MG tablet Take 1 tablet by mouth every 4 (four) hours as needed. 07/07/16   Jacalyn Lefevre, MD  imipramine (TOFRANIL) 25 MG tablet Take 1 tablet (25 mg total) by mouth at bedtime. 07/26/16   Sater, Pearletha Furl, MD  Menthol, Topical  Analgesic, (ICY HOT EX) Apply 1 application topically daily.    [provider]  naproxen sodium (ANAPROX) 220 MG tablet Take 440 mg by mouth as needed (for headache).    [provider]  ondansetron (ZOFRAN ODT) 4 MG disintegrating tablet 4mg  ODT q4 hours prn nausea/vomit 02/19/17   Felicie Morn, NP  oxyCODONE-acetaminophen (PERCOCET/ROXICET) 5-325 MG tablet Take 1 tablet by mouth every 6 (six) hours as needed for severe pain. 02/19/17   Felicie Morn, NP  sertraline (ZOLOFT) 50 MG tablet Take 1 tablet (50 mg total) by mouth daily. 07/26/16   Sater, Pearletha Furl, MD  tamsulosin (FLOMAX) 0.4 MG CAPS capsule Take 1 capsule (0.4 mg total) by mouth daily. 02/19/17   Felicie Morn, NP  traZODone (DESYREL) 50 MG tablet Take 1 tablet (50 mg total) by mouth at bedtime. 10/17/16   Sater, Pearletha Furl, MD    Family History Family History  Problem Relation Age of Onset  . Depression Mother   . Hypertension Mother   . Bipolar disorder Mother   . Gout Father   . Hypertension Father     Social History Social History  Substance Use Topics  . Smoking status: Current Every Day Smoker    Types: Cigars  . Smokeless tobacco: Never Used  . Alcohol use Yes     Comment: occ     Allergies   Other   Review of Systems Review of Systems  Constitutional: Negative for fever.  Respiratory: Negative for shortness of breath.   Cardiovascular: Negative for chest pain.  Musculoskeletal:       Negative aside from HPI  Skin:       Negative aside from HPI  Allergic/Immunologic: Negative for immunocompromised state.  Neurological: Negative for weakness.     Physical Exam Updated Vital Signs BP (!) 144/119   Pulse 88   Temp 98.3 F (36.8 C) (Oral)   Resp 14   Ht 5' (1.524 m)   Wt 123 lb (55.8 kg)   LMP 03/19/2017   SpO2 100%   BMI 24.02 kg/m   Physical Exam  Constitutional: She is oriented to person, place, and time. She appears well-developed and well-nourished. No distress.  HENT:    Head: Normocephalic and atraumatic.  Eyes: Conjunctivae and EOM are normal.  Cardiovascular: Normal rate, regular rhythm and intact distal pulses.   Pulmonary/Chest: Effort normal. No stridor. No respiratory distress.  Musculoskeletal: She exhibits no edema.       Feet:  Neurological: She is alert and oriented to person, place, and time. No cranial nerve deficit.  Skin: Skin is warm and dry.  Psychiatric: She has a normal mood and affect.  Nursing note and vitals reviewed.    ED Treatments / Results   Radiology Dg Foot Complete Right  Result Date: 04/14/2017 CLINICAL DATA:  Stepped on nail in midfoot EXAM: RIGHT FOOT COMPLETE - 3+ VIEW COMPARISON:  None. FINDINGS: There is no evidence of fracture or dislocation. There is no evidence of arthropathy or other focal bone abnormality. Soft tissues are unremarkable. IMPRESSION: Negative. Electronically Signed   By: Charlett Nose M.D.   On: 04/14/2017 08:52    Procedures Procedures (including critical care time)  Medications Ordered in ED Medications  Tdap (BOOSTRIX) injection 0.5 mL (0.5 mLs Intramuscular Given 04/14/17 0837)  acetaminophen (TYLENOL) tablet 1,000 mg (1,000 mg Oral Given 04/14/17 0837)  ciprofloxacin (CIPRO) tablet 500 mg (500 mg Oral Given 04/14/17 0837)     Initial Impression / Assessment and Plan / ED Course  I have reviewed the triage vital signs and the nursing notes.  Pertinent labs & imaging results that were available during my care of the patient were reviewed by me and considered in my medical decision making (see chart for details).  On repeat exam the patient is in no distress discussed all findings, need for medication compliance with ciprofloxacin, as well as close monitoring given the nature of the puncture wound. With no evidence for retained foreign body, no evidence for fracture, patient is appropriate for discharge with close outpatient follow-up.   Final Clinical Impressions(s) / ED Diagnoses    Final diagnoses:  Nail, injury by, initial encounter    New Prescriptions Discharge Medication List as of 04/14/2017  8:57 AM    START taking these medications   Details  ciprofloxacin (CIPRO) 500 MG tablet Take 1 tablet (500 mg total) by mouth 2 (two) times daily., Starting Sun 04/14/2017, Print         Gerhard Munch, MD 04/14/17 [phone]

## 2017-04-14 NOTE — ED Notes (Signed)
Patient transported to X-ray 

## 2017-04-14 NOTE — Discharge Instructions (Signed)
As discussed, with your wound today it is important that you monitor your condition carefully, taking all medication as directed, and do not hesitate to return here for concerning changes.  Otherwise, please use Tylenol and ibuprofen for pain control, soak the foot 3 times daily for the next 3 days, and keep the wound clean and dry.

## 2017-04-14 NOTE — ED Triage Notes (Addendum)
Patient brought in by PTAR from home for stepping on a nail this am that was rusty on her back deck. Patient reports that nail went through patient's tennis shoe. Patient pulled nail out herself and was about 2 cm deep.  PTAR reports no bleeding when on scene.  Patient unsure of when her last tetanus shot was. Patient reports that she vomited twice after stepping on nail.

## 2017-05-06 ENCOUNTER — Other Ambulatory Visit: Payer: Medicaid Other

## 2017-08-11 IMAGING — DX DG LUMBAR SPINE COMPLETE 4+V
5 series · 5 of 5 positions shown · non-contrast
Comparison: Lumbar spine radiographs performed 07/07/2016

CLINICAL DATA: Chronic lower back pain, radiating to both legs.
Initial encounter.

EXAM:
LUMBAR SPINE - COMPLETE 4+ VIEW

[l-spine ap]
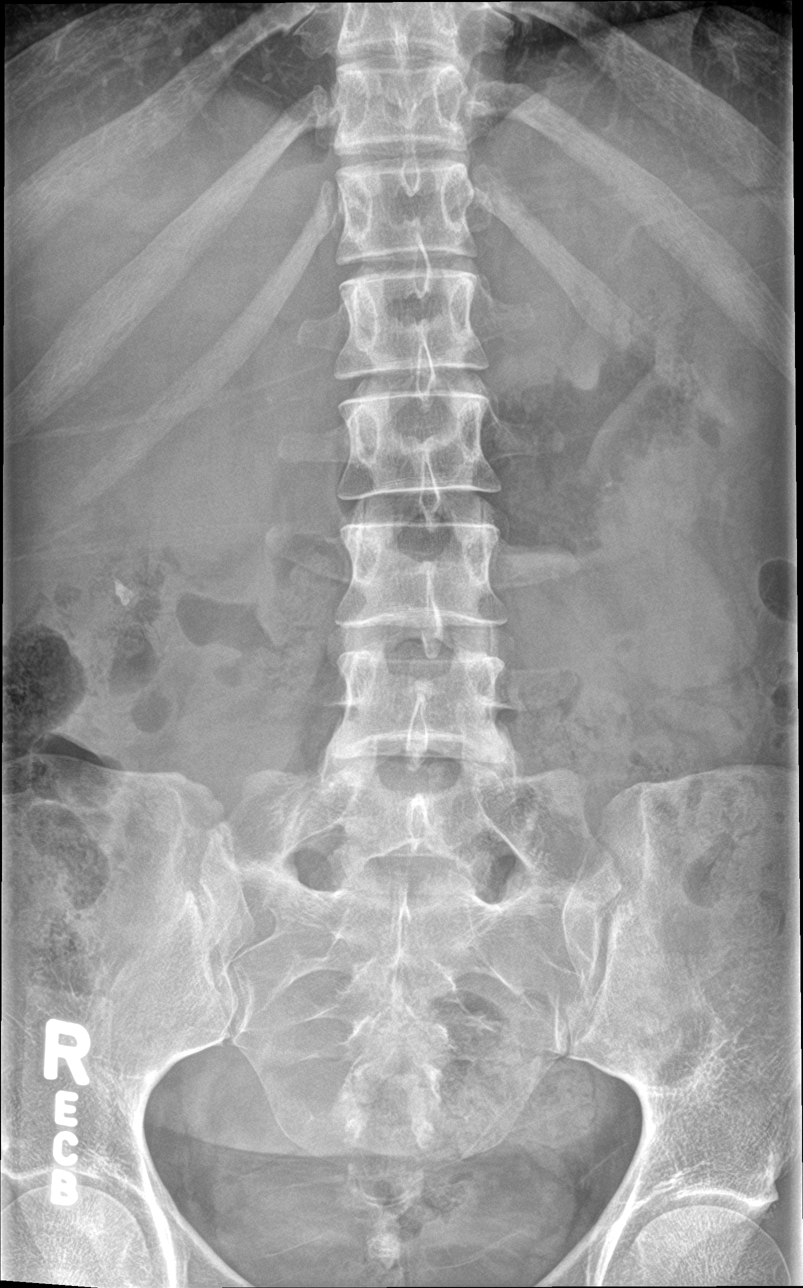

[l-spine obl (1 of 2)]
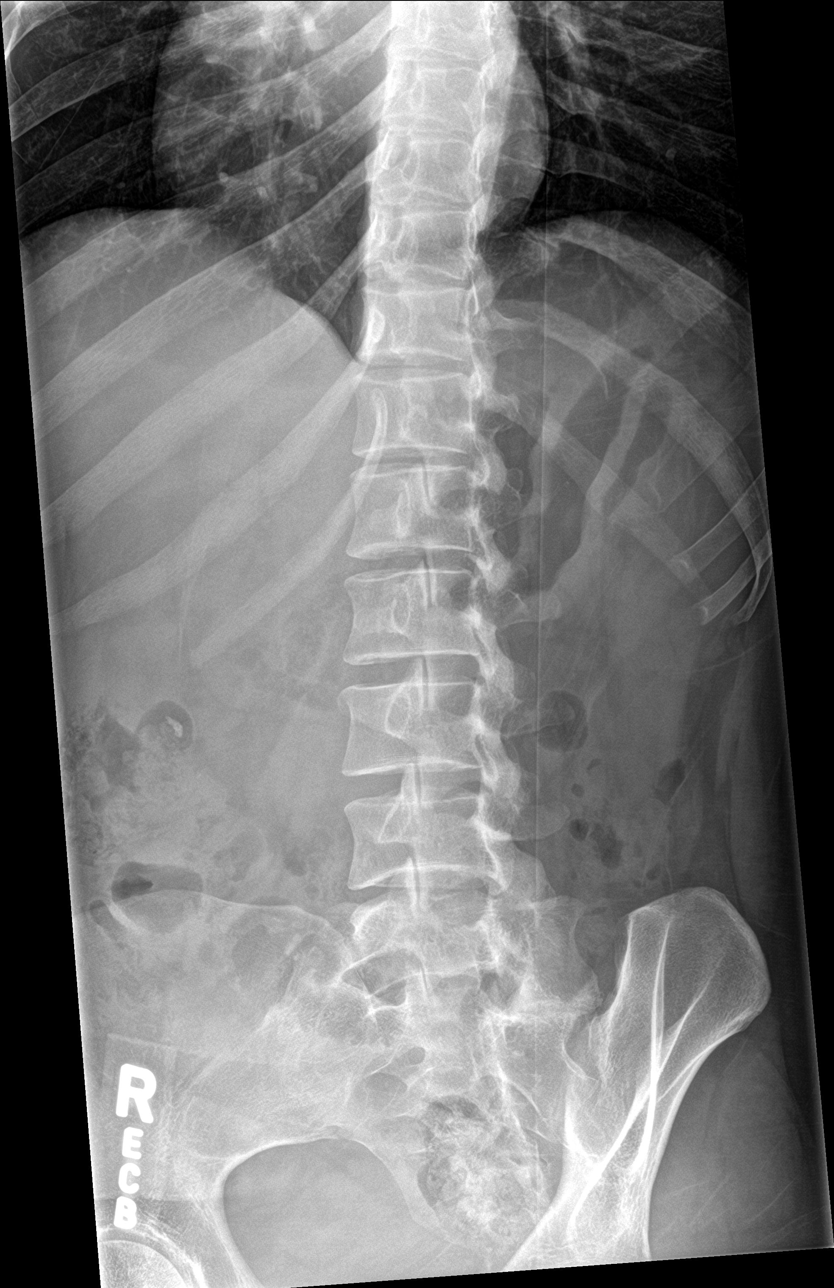

[l-spine obl (2 of 2)]
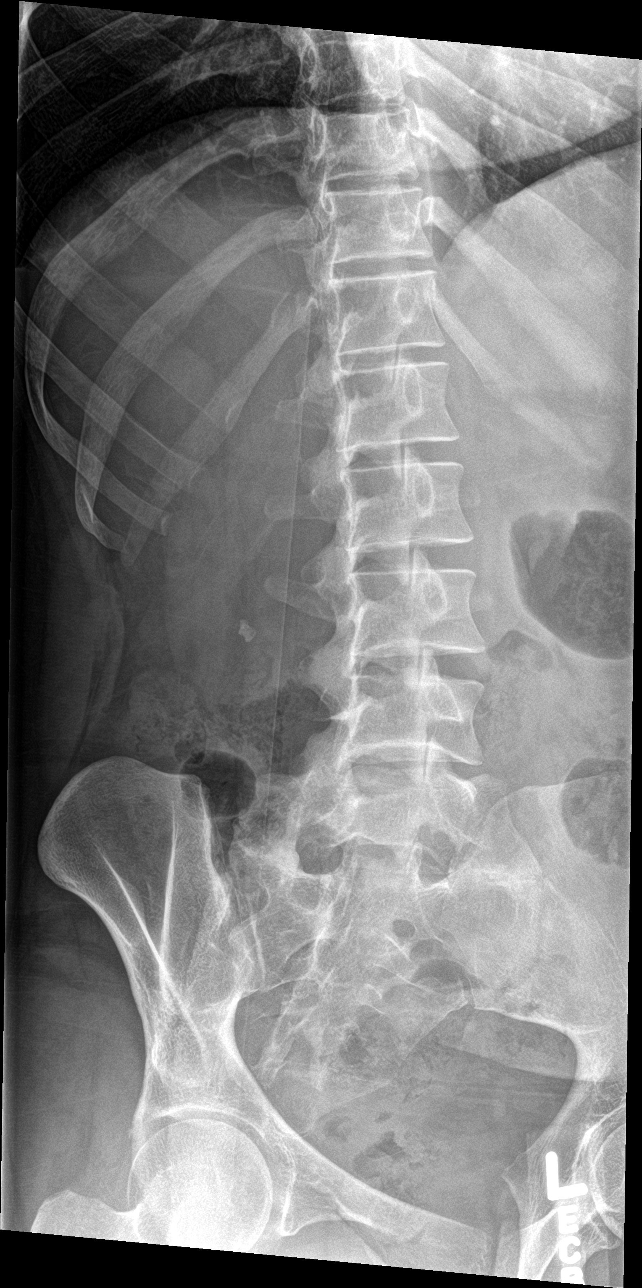

[l-spine lat]
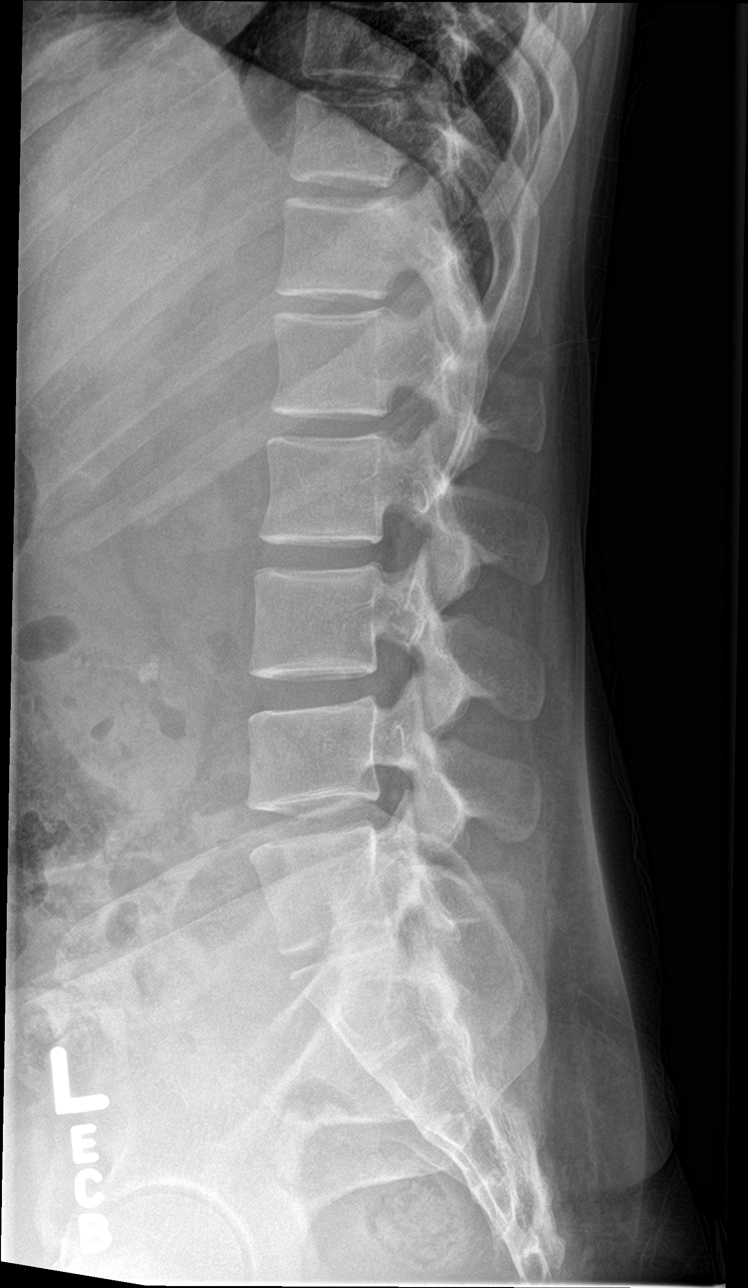

[l-spine spot]
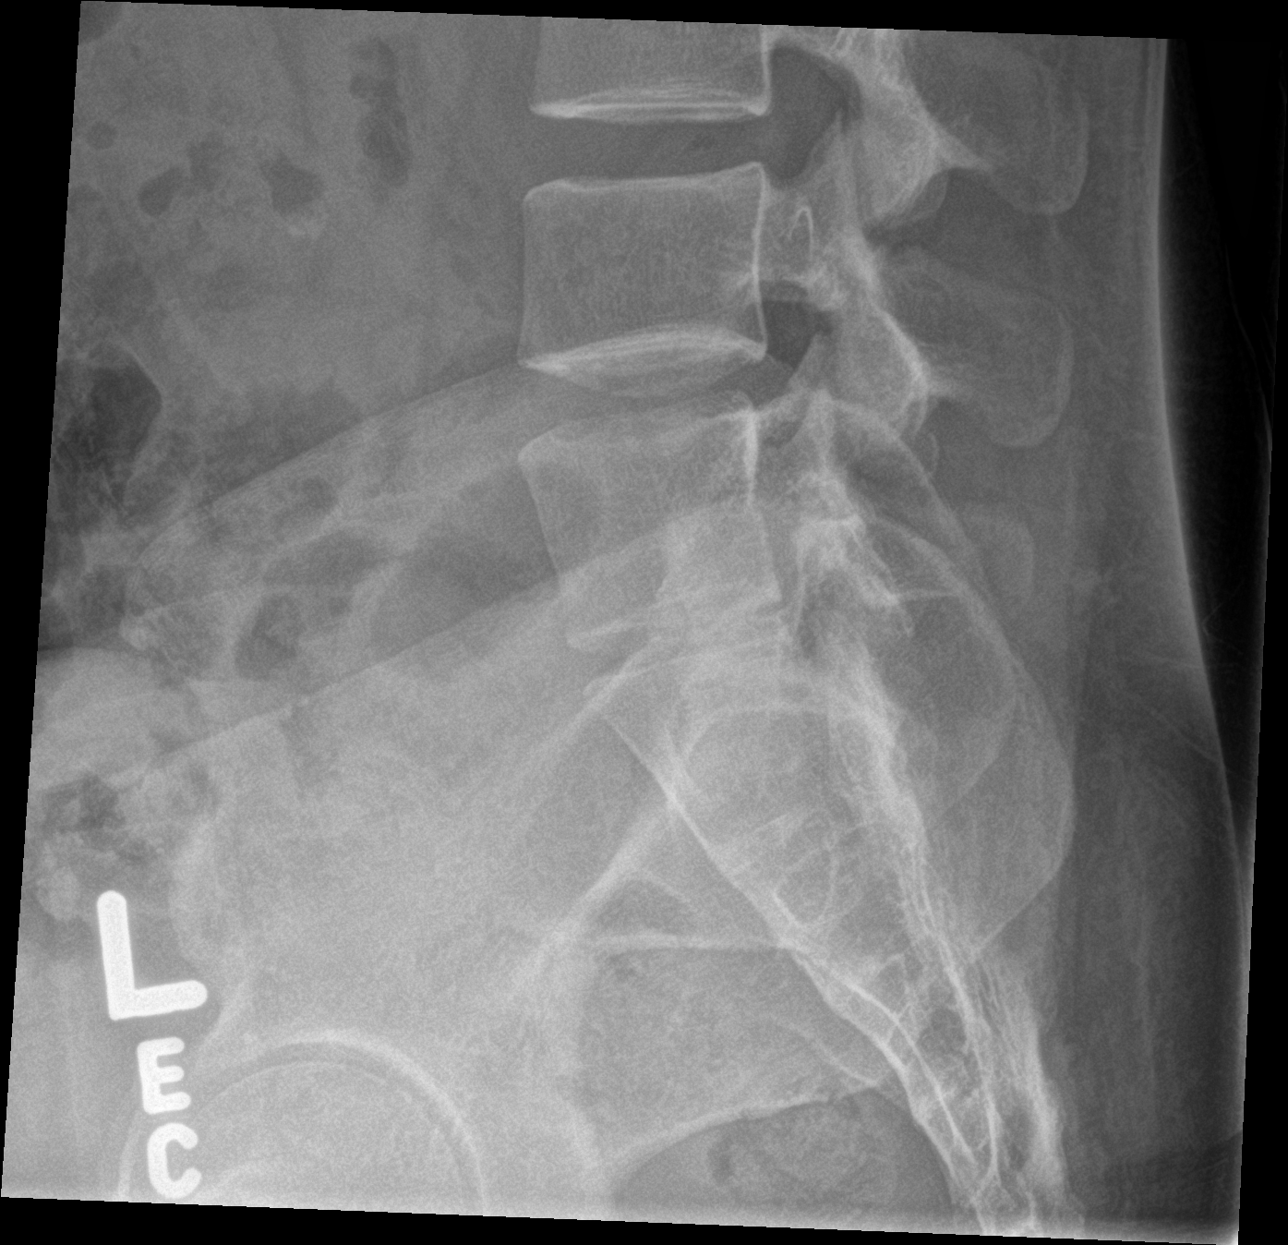

[5 of 5 positions shown; findings below may reference images not displayed]

FINDINGS: There is no evidence of fracture or subluxation. Vertebral bodies
demonstrate normal height and alignment. Intervertebral disc spaces
are preserved. The visualized neural foramina are grossly
unremarkable in appearance.

An apparent 8 mm nonobstructing stone is noted at the lower pole of
the right renal shadow.

The visualized bowel gas pattern is unremarkable in appearance; air
and stool are noted within the colon. The sacroiliac joints are
within normal limits.
IMPRESSION: 1. No evidence of fracture or subluxation along the lumbar spine.
2. Apparent 8 mm nonobstructing stone at the lower pole of the right
renal shadow.

## 2018-03-20 ENCOUNTER — Telehealth: Payer: Self-pay | Admitting: Neurology

## 2018-03-20 NOTE — Telephone Encounter (Signed)
Pt called she is having increase in HA's with severity and intensity. Pt was last seen 10/17/16, did not have MRI either. Pt is wanting to be seen sooner if possible.

## 2018-03-20 NOTE — Telephone Encounter (Signed)
Noted.  Will call her if sooner appt. becomes available/fim

## 2018-05-05 ENCOUNTER — Ambulatory Visit: Payer: Medicaid Other | Admitting: Neurology

## 2018-05-05 ENCOUNTER — Other Ambulatory Visit: Payer: Self-pay

## 2018-05-05 ENCOUNTER — Telehealth: Payer: Self-pay | Admitting: Neurology

## 2018-05-05 ENCOUNTER — Encounter: Payer: Self-pay | Admitting: Neurology

## 2018-05-05 VITALS — BP 110/66 | HR 74 | Resp 14 | Ht 60.0 in | Wt 122.0 lb

## 2018-05-05 DIAGNOSIS — R42 Dizziness and giddiness: Secondary | ICD-10-CM

## 2018-05-05 DIAGNOSIS — G935 Compression of brain: Secondary | ICD-10-CM | POA: Diagnosis not present

## 2018-05-05 DIAGNOSIS — R200 Anesthesia of skin: Secondary | ICD-10-CM | POA: Diagnosis not present

## 2018-05-05 DIAGNOSIS — F418 Other specified anxiety disorders: Secondary | ICD-10-CM

## 2018-05-05 DIAGNOSIS — G4700 Insomnia, unspecified: Secondary | ICD-10-CM | POA: Diagnosis not present

## 2018-05-05 MED ORDER — IMIPRAMINE HCL 25 MG PO TABS: 25.0000 mg | ORAL_TABLET | Freq: Every day | ORAL | 5 refills | Status: AC

## 2018-05-05 MED ORDER — SERTRALINE HCL 50 MG PO TABS: 50.0000 mg | ORAL_TABLET | Freq: Every day | ORAL | 11 refills | Status: AC

## 2018-05-05 NOTE — Telephone Encounter (Signed)
medicaid order sent to GI. They obtain the auth and will reach out to the pt to schedule.  °
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
## 2018-06-04 ENCOUNTER — Telehealth: Payer: Self-pay | Admitting: Neurology

## 2018-06-04 NOTE — Telephone Encounter (Signed)
[id number] - [place], [place] - [address] AT [address] has called re: the imipramine (TOFRANIL) 25 MG tablet, she states this medication will not be available until 08-25th, she wants to know if Dr Epimenio Foot would like to try something else.  Please call

## 2018-08-25 ENCOUNTER — Telehealth: Payer: Self-pay | Admitting: Neurology

## 2018-08-25 NOTE — Telephone Encounter (Signed)
Patient requesting to get an MRI scheduled before she sees Dr. Epimenio Foot on 09-04-18.

## 2018-08-25 NOTE — Telephone Encounter (Signed)
LMOM that RAS will order MRi once he sees her, if needed/fim

## 2018-09-04 ENCOUNTER — Ambulatory Visit: Payer: Medicaid Other | Admitting: Neurology

## 2018-09-04 ENCOUNTER — Other Ambulatory Visit: Payer: Self-pay

## 2018-09-04 ENCOUNTER — Encounter: Payer: Self-pay | Admitting: Neurology

## 2018-09-04 VITALS — BP 128/88 | HR 94 | Resp 16 | Ht 60.0 in | Wt 120.0 lb

## 2018-09-04 DIAGNOSIS — G935 Compression of brain: Secondary | ICD-10-CM

## 2018-09-04 DIAGNOSIS — G4700 Insomnia, unspecified: Secondary | ICD-10-CM

## 2018-09-04 DIAGNOSIS — R42 Dizziness and giddiness: Secondary | ICD-10-CM | POA: Diagnosis not present

## 2018-09-04 DIAGNOSIS — M542 Cervicalgia: Secondary | ICD-10-CM | POA: Diagnosis not present

## 2018-09-04 MED ORDER — TIZANIDINE HCL 4 MG PO TABS: ORAL_TABLET | ORAL | 5 refills | Status: DC
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
## 2018-09-05 ENCOUNTER — Telehealth: Payer: Self-pay | Admitting: Neurology

## 2018-09-05 NOTE — Telephone Encounter (Signed)
medicaid pending faxed clinical notes  °

## 2018-09-08 NOTE — Telephone Encounter (Signed)
Medicaid Berkley Harvey: [id number] (exp. 09/05/18 to 10/05/18) patient is scheduled at [facility] for 09/16/18 arrival timei s 3:30 pm. Patient is aware of time & day. She also has their number of [phone] if for some reason she needed to r/s.

## 2018-09-16 ENCOUNTER — Ambulatory Visit (HOSPITAL_COMMUNITY)
Admission: RE | Admit: 2018-09-16 | Discharge: 2018-09-16 | Disposition: A | Discharge status: Home / Self Care | Payer: Medicaid Other | Source: Ambulatory Visit | Attending: Neurology | Admitting: Neurology

## 2018-09-16 DIAGNOSIS — M542 Cervicalgia: Secondary | ICD-10-CM | POA: Diagnosis present

## 2018-09-16 DIAGNOSIS — G935 Compression of brain: Secondary | ICD-10-CM | POA: Insufficient documentation

## 2018-09-22 ENCOUNTER — Telehealth: Payer: Self-pay

## 2018-09-22 DIAGNOSIS — G935 Compression of brain: Secondary | ICD-10-CM

## 2018-09-22 NOTE — Telephone Encounter (Signed)
-----   Message from Hillis Range, RN sent at 09/22/2018  8:09 AM EDT -----   ----- Message ----- From: Asa Lente, MD Sent: 09/19/2018   2:35 PM EDT To: Magnus Sinning Misenheimer, RN  Please let her know that the MRI of the cervical spine looked okay.  The back of the brain looked the same as in the 2017 MRI and there was no compression of the spinal cord.

## 2018-09-22 NOTE — Telephone Encounter (Signed)
I called pt to discuss her MRI results. No answer, left a message asking her to call me back. 

## 2018-09-24 NOTE — Telephone Encounter (Signed)
Called, LVM for pt to call about results.  

## 2018-09-29 ENCOUNTER — Encounter: Payer: Self-pay | Admitting: *Deleted

## 2018-09-29 MED ORDER — ETODOLAC 400 MG PO TABS: 400.0000 mg | ORAL_TABLET | Freq: Two times a day (BID) | ORAL | 0 refills | Status: AC

## 2018-09-29 NOTE — Telephone Encounter (Signed)
Patient called office back. Relayed results per Dr. Epimenio Foot note. She verbalized understanding.   She reports she is still having pressure/pain. Imipramine, aleve, excedrin, nerve block all ineffective. Wondering if there is another antiinflammatory medication she can try.  Did advise per Dr. Epimenio Foot last OV that he recommended referral to neurosurgery for consult if he sx did not improve. She is agreeable if Dr. Epimenio Foot feels she needs to proceed with this. Advised if ok, we will place referral.

## 2018-09-29 NOTE — Telephone Encounter (Signed)
Called, LVM for pt to call about results. Been unable to reach x3. Will send letter to pt about results.

## 2018-09-29 NOTE — Addendum Note (Signed)
Addended by: Hillis Range on: 09/29/2018 02:24 PM   Modules accepted: Orders

## 2018-09-29 NOTE — Telephone Encounter (Signed)
Spoke with Dr. Epimenio Foot-  He is ok with referring her to neurosurgery for consult. Can offer etodolac 400mg  BID for her to try instead of taking naproxen. Cannot take both.

## 2018-09-29 NOTE — Telephone Encounter (Signed)
I called pt. She is agreeable to try etodolac 400mg  BID. She is aware not to take this with naproxen/aleve. Verified pharmacy on file.   Also advised we will send referral to neurosurgery. She verbalized understanding.

## 2018-10-02 ENCOUNTER — Telehealth: Payer: Self-pay | Admitting: Neurology

## 2018-10-02 NOTE — Telephone Encounter (Signed)
  PA completed through [facility] with Katie at [phone] for Etodolac. Approved until 09/27/2019. PA # [id number] REF # for the call- [id number].  Faxed this information to [facility] so they could fill for the pt

## 2018-11-10 DIAGNOSIS — Z0289 Encounter for other administrative examinations: Secondary | ICD-10-CM

## 2019-01-05 ENCOUNTER — Encounter: Payer: Self-pay | Admitting: Neurology

## 2019-03-06 ENCOUNTER — Ambulatory Visit: Payer: Medicaid Other | Admitting: Neurology

## 2019-03-30 ENCOUNTER — Telehealth: Payer: Self-pay | Admitting: *Deleted

## 2019-03-30 NOTE — Telephone Encounter (Signed)
Called, LVM for pt to call office back before her appt tomorrow with Dr. Epimenio Foot so I can update her chart info

## 2019-03-31 ENCOUNTER — Ambulatory Visit: Payer: Medicaid Other | Admitting: Neurology

## 2019-03-31 ENCOUNTER — Other Ambulatory Visit: Payer: Self-pay

## 2019-03-31 NOTE — Telephone Encounter (Signed)
Received message from Fleming at check in that she was unable to reach pt by phone. I tried all numbers on file. Unable to reach pt as well.

## 2019-04-23 ENCOUNTER — Ambulatory Visit: Payer: Medicaid Other | Admitting: Neurology

## 2022-02-17 ENCOUNTER — Emergency Department (HOSPITAL_COMMUNITY)
Admission: EM | Admit: 2022-02-17 | Discharge: 2022-02-17 | Disposition: A | Discharge status: Home / Self Care | Payer: Medicaid Other | Attending: Emergency Medicine | Admitting: Emergency Medicine

## 2022-02-17 ENCOUNTER — Other Ambulatory Visit: Payer: Self-pay

## 2022-02-17 ENCOUNTER — Encounter (HOSPITAL_COMMUNITY): Payer: Self-pay | Admitting: Emergency Medicine

## 2022-02-17 DIAGNOSIS — Y92410 Unspecified street and highway as the place of occurrence of the external cause: Secondary | ICD-10-CM | POA: Diagnosis not present

## 2022-02-17 DIAGNOSIS — R110 Nausea: Secondary | ICD-10-CM | POA: Diagnosis not present

## 2022-02-17 DIAGNOSIS — R519 Headache, unspecified: Secondary | ICD-10-CM | POA: Diagnosis not present

## 2022-02-17 DIAGNOSIS — S161XXA Strain of muscle, fascia and tendon at neck level, initial encounter: Secondary | ICD-10-CM | POA: Diagnosis not present

## 2022-02-17 DIAGNOSIS — S199XXA Unspecified injury of neck, initial encounter: Secondary | ICD-10-CM | POA: Diagnosis present

## 2022-02-17 DIAGNOSIS — T148XXA Other injury of unspecified body region, initial encounter: Secondary | ICD-10-CM

## 2022-02-17 LAB — BASIC METABOLIC PANEL
Anion gap: 4 — ABNORMAL LOW (ref 5–15)
BUN: 11 mg/dL (ref 6–20)
CO2: 25 mmol/L (ref 22–32)
Calcium: 9.1 mg/dL (ref 8.9–10.3)
Chloride: 109 mmol/L (ref 98–111)
Creatinine, Ser: 0.58 mg/dL (ref 0.44–1.00)
GFR, Estimated: >60 mL/min (ref >60)
Glucose, Bld: 91 mg/dL (ref 70–99)
Potassium: 4 mmol/L (ref 3.5–5.1)
Sodium: 138 mmol/L (ref 135–145)

## 2022-02-17 LAB — URINALYSIS, ROUTINE W REFLEX MICROSCOPIC
Bilirubin Urine: NEGATIVE
Glucose, UA: NEGATIVE mg/dL
Ketones, ur: NEGATIVE mg/dL
Nitrite: NEGATIVE
Protein, ur: 30 mg/dL — AB
RBC / HPF: >50 RBC/hpf — ABNORMAL HIGH (ref 0–5)
Specific Gravity, Urine: 1.017 (ref 1.005–1.030)
pH: 6 (ref 5.0–8.0)

## 2022-02-17 LAB — CBC
HCT: 31.9 % — ABNORMAL LOW (ref 36.0–46.0)
Hemoglobin: 9.1 g/dL — ABNORMAL LOW (ref 12.0–15.0)
MCH: 23.1 pg — ABNORMAL LOW (ref 26.0–34.0)
MCHC: 28.5 g/dL — ABNORMAL LOW (ref 30.0–36.0)
MCV: 81 fL (ref 80.0–100.0)
Platelets: 336 10*3/uL (ref 150–400)
RBC: 3.94 MIL/uL (ref 3.87–5.11)
RDW: 20 % — ABNORMAL HIGH (ref 11.5–15.5)
WBC: 8.2 10*3/uL (ref 4.0–10.5)
nRBC: 0 % (ref 0.0–0.2)

## 2022-02-17 LAB — I-STAT BETA HCG BLOOD, ED (MC, WL, AP ONLY): I-stat hCG, quantitative: <5 m[IU]/mL (ref <5)

## 2022-02-17 MED ORDER — SODIUM CHLORIDE 0.9% FLUSH: 3.0000 mL | Freq: Once | INTRAVENOUS | Status: DC

## 2022-02-17 MED ORDER — CYCLOBENZAPRINE HCL 7.5 MG PO TABS: 7.5000 mg | ORAL_TABLET | Freq: Three times a day (TID) | ORAL | 0 refills | Status: AC | PRN

## 2022-02-17 MED ORDER — CYCLOBENZAPRINE HCL 10 MG PO TABS
5.0000 mg | ORAL_TABLET | Freq: Once | ORAL | Status: AC
  Administered 2022-02-17: 5 mg via ORAL
  Filled 2022-02-17: qty 1

## 2022-02-17 MED ORDER — LIDOCAINE 5 % EX PTCH: 1.0000 | MEDICATED_PATCH | CUTANEOUS | 0 refills | Status: AC
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
## 2022-02-17 NOTE — Discharge Instructions (Signed)
You have been seen and discharged from the emergency department.  Take muscle relaxer as needed/directed.  Do not mix this medication with alcohol or other sedating medications. Do not drive or do heavy physical activity until you know how this medication affects you.  It may cause drowsiness.  Take Tylenol/ibuprofen for pain control.  Use pain patches as needed.  Follow-up with your primary provider for further evaluation and further care. Take home medications as prescribed. If you have any worsening symptoms or further concerns for your health please return to an emergency department for further evaluation. ?
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
## 2024-06-12 ENCOUNTER — Encounter: Payer: Self-pay | Admitting: Advanced Practice Midwife
